# Patient Record
Sex: Male | Born: 1945 | Race: White | Hispanic: No | Marital: Married | Smoking: Former smoker
Health system: Southern US, Community
[De-identification: ages and names within clinical notes are randomized; demographics above are authoritative.]

## PROBLEM LIST (undated history)

## (undated) DIAGNOSIS — N529 Male erectile dysfunction, unspecified: Secondary | ICD-10-CM

## (undated) DIAGNOSIS — I739 Peripheral vascular disease, unspecified: Secondary | ICD-10-CM

## (undated) DIAGNOSIS — D649 Anemia, unspecified: Secondary | ICD-10-CM

## (undated) DIAGNOSIS — E785 Hyperlipidemia, unspecified: Secondary | ICD-10-CM

## (undated) DIAGNOSIS — R739 Hyperglycemia, unspecified: Secondary | ICD-10-CM

## (undated) DIAGNOSIS — R600 Localized edema: Secondary | ICD-10-CM

## (undated) DIAGNOSIS — N401 Enlarged prostate with lower urinary tract symptoms: Secondary | ICD-10-CM

## (undated) DIAGNOSIS — N419 Inflammatory disease of prostate, unspecified: Secondary | ICD-10-CM

## (undated) DIAGNOSIS — I10 Essential (primary) hypertension: Secondary | ICD-10-CM

## (undated) DIAGNOSIS — K219 Gastro-esophageal reflux disease without esophagitis: Secondary | ICD-10-CM

## (undated) DIAGNOSIS — N138 Other obstructive and reflux uropathy: Secondary | ICD-10-CM

## (undated) DIAGNOSIS — N400 Enlarged prostate without lower urinary tract symptoms: Secondary | ICD-10-CM

## (undated) DIAGNOSIS — Z8673 Personal history of transient ischemic attack (TIA), and cerebral infarction without residual deficits: Secondary | ICD-10-CM

## (undated) DIAGNOSIS — I639 Cerebral infarction, unspecified: Secondary | ICD-10-CM

## (undated) HISTORY — DX: Hyperglycemia, unspecified: R73.9

## (undated) HISTORY — DX: Benign prostatic hyperplasia without lower urinary tract symptoms: N40.0

## (undated) HISTORY — DX: Cerebral infarction, unspecified: I63.9

## (undated) HISTORY — DX: Other obstructive and reflux uropathy: N13.8

## (undated) HISTORY — DX: Essential (primary) hypertension: I10

## (undated) HISTORY — PX: COLONOSCOPY WITH ESOPHAGOGASTRODUODENOSCOPY (EGD): SHX5779

## (undated) HISTORY — DX: Inflammatory disease of prostate, unspecified: N41.9

## (undated) HISTORY — DX: Benign prostatic hyperplasia with lower urinary tract symptoms: N40.1

## (undated) HISTORY — DX: Gastro-esophageal reflux disease without esophagitis: K21.9

## (undated) HISTORY — DX: Male erectile dysfunction, unspecified: N52.9

## (undated) HISTORY — DX: Anemia, unspecified: D64.9

---

## 1997-09-28 ENCOUNTER — Emergency Department (HOSPITAL_COMMUNITY): Admission: EM | Admit: 1997-09-28 | Discharge: 1997-09-28 | Payer: Self-pay | Admitting: Emergency Medicine

## 1998-12-01 ENCOUNTER — Emergency Department (HOSPITAL_COMMUNITY): Admission: EM | Admit: 1998-12-01 | Discharge: 1998-12-01 | Payer: Self-pay | Admitting: Emergency Medicine

## 1998-12-01 ENCOUNTER — Encounter: Payer: Self-pay | Admitting: Emergency Medicine

## 1999-09-08 ENCOUNTER — Ambulatory Visit (HOSPITAL_COMMUNITY): Admission: AD | Admit: 1999-09-08 | Discharge: 1999-09-08 | Payer: Self-pay | Admitting: Cardiovascular Disease

## 1999-09-20 ENCOUNTER — Ambulatory Visit (HOSPITAL_COMMUNITY): Admission: RE | Admit: 1999-09-20 | Discharge: 1999-09-20 | Payer: Self-pay | Admitting: Internal Medicine

## 1999-09-20 ENCOUNTER — Encounter: Payer: Self-pay | Admitting: Internal Medicine

## 2003-01-14 ENCOUNTER — Ambulatory Visit (HOSPITAL_COMMUNITY): Admission: RE | Admit: 2003-01-14 | Discharge: 2003-01-14 | Payer: Self-pay | Admitting: Internal Medicine

## 2003-01-15 ENCOUNTER — Ambulatory Visit (HOSPITAL_COMMUNITY): Admission: RE | Admit: 2003-01-15 | Discharge: 2003-01-15 | Payer: Self-pay | Admitting: Internal Medicine

## 2004-02-22 ENCOUNTER — Other Ambulatory Visit: Payer: Self-pay

## 2004-02-22 ENCOUNTER — Inpatient Hospital Stay: Payer: Self-pay | Admitting: Internal Medicine

## 2004-02-22 ENCOUNTER — Emergency Department: Payer: Self-pay | Admitting: Emergency Medicine

## 2004-07-16 ENCOUNTER — Inpatient Hospital Stay: Payer: Self-pay | Admitting: Internal Medicine

## 2010-04-09 ENCOUNTER — Encounter: Payer: Self-pay | Admitting: Internal Medicine

## 2011-11-27 ENCOUNTER — Ambulatory Visit: Payer: Self-pay | Admitting: Gastroenterology

## 2011-11-27 LAB — PROTIME-INR
INR: 1.1
Prothrombin Time: 14.4 secs (ref 11.5–14.7)

## 2011-11-28 LAB — PATHOLOGY REPORT

## 2013-10-21 ENCOUNTER — Ambulatory Visit: Payer: Self-pay | Admitting: Internal Medicine

## 2014-04-08 DIAGNOSIS — I6789 Other cerebrovascular disease: Secondary | ICD-10-CM | POA: Diagnosis not present

## 2014-04-09 DIAGNOSIS — K219 Gastro-esophageal reflux disease without esophagitis: Secondary | ICD-10-CM | POA: Diagnosis not present

## 2014-04-09 DIAGNOSIS — D649 Anemia, unspecified: Secondary | ICD-10-CM | POA: Diagnosis not present

## 2014-04-09 DIAGNOSIS — R739 Hyperglycemia, unspecified: Secondary | ICD-10-CM | POA: Diagnosis not present

## 2014-04-09 DIAGNOSIS — I10 Essential (primary) hypertension: Secondary | ICD-10-CM | POA: Diagnosis not present

## 2014-04-09 DIAGNOSIS — Z0000 Encounter for general adult medical examination without abnormal findings: Secondary | ICD-10-CM | POA: Diagnosis not present

## 2014-04-09 DIAGNOSIS — I739 Peripheral vascular disease, unspecified: Secondary | ICD-10-CM | POA: Diagnosis not present

## 2014-04-09 DIAGNOSIS — Z8673 Personal history of transient ischemic attack (TIA), and cerebral infarction without residual deficits: Secondary | ICD-10-CM | POA: Diagnosis not present

## 2014-04-09 DIAGNOSIS — E785 Hyperlipidemia, unspecified: Secondary | ICD-10-CM | POA: Diagnosis not present

## 2014-04-16 DIAGNOSIS — R739 Hyperglycemia, unspecified: Secondary | ICD-10-CM | POA: Diagnosis not present

## 2014-04-16 DIAGNOSIS — Z8673 Personal history of transient ischemic attack (TIA), and cerebral infarction without residual deficits: Secondary | ICD-10-CM | POA: Diagnosis not present

## 2014-04-16 DIAGNOSIS — E785 Hyperlipidemia, unspecified: Secondary | ICD-10-CM | POA: Diagnosis not present

## 2014-04-16 DIAGNOSIS — I10 Essential (primary) hypertension: Secondary | ICD-10-CM | POA: Diagnosis not present

## 2014-04-21 ENCOUNTER — Ambulatory Visit: Payer: Self-pay | Admitting: Internal Medicine

## 2014-04-21 DIAGNOSIS — I6523 Occlusion and stenosis of bilateral carotid arteries: Secondary | ICD-10-CM | POA: Diagnosis not present

## 2014-05-14 DIAGNOSIS — Z8673 Personal history of transient ischemic attack (TIA), and cerebral infarction without residual deficits: Secondary | ICD-10-CM | POA: Diagnosis not present

## 2014-05-14 DIAGNOSIS — I739 Peripheral vascular disease, unspecified: Secondary | ICD-10-CM | POA: Diagnosis not present

## 2014-05-14 DIAGNOSIS — I6789 Other cerebrovascular disease: Secondary | ICD-10-CM | POA: Diagnosis not present

## 2014-06-11 DIAGNOSIS — I6789 Other cerebrovascular disease: Secondary | ICD-10-CM | POA: Diagnosis not present

## 2014-07-14 DIAGNOSIS — I6789 Other cerebrovascular disease: Secondary | ICD-10-CM | POA: Diagnosis not present

## 2014-08-13 DIAGNOSIS — I6789 Other cerebrovascular disease: Secondary | ICD-10-CM | POA: Diagnosis not present

## 2014-09-10 DIAGNOSIS — Z8673 Personal history of transient ischemic attack (TIA), and cerebral infarction without residual deficits: Secondary | ICD-10-CM | POA: Diagnosis not present

## 2014-10-13 DIAGNOSIS — Z125 Encounter for screening for malignant neoplasm of prostate: Secondary | ICD-10-CM | POA: Diagnosis not present

## 2014-10-13 DIAGNOSIS — E785 Hyperlipidemia, unspecified: Secondary | ICD-10-CM | POA: Diagnosis not present

## 2014-10-13 DIAGNOSIS — D649 Anemia, unspecified: Secondary | ICD-10-CM | POA: Diagnosis not present

## 2014-10-13 DIAGNOSIS — I739 Peripheral vascular disease, unspecified: Secondary | ICD-10-CM | POA: Diagnosis not present

## 2014-10-13 DIAGNOSIS — I10 Essential (primary) hypertension: Secondary | ICD-10-CM | POA: Diagnosis not present

## 2014-10-20 DIAGNOSIS — I10 Essential (primary) hypertension: Secondary | ICD-10-CM | POA: Diagnosis not present

## 2014-10-20 DIAGNOSIS — R739 Hyperglycemia, unspecified: Secondary | ICD-10-CM | POA: Diagnosis not present

## 2014-10-20 DIAGNOSIS — Z8673 Personal history of transient ischemic attack (TIA), and cerebral infarction without residual deficits: Secondary | ICD-10-CM | POA: Diagnosis not present

## 2014-10-20 DIAGNOSIS — E784 Other hyperlipidemia: Secondary | ICD-10-CM | POA: Diagnosis not present

## 2014-11-13 DIAGNOSIS — Z8673 Personal history of transient ischemic attack (TIA), and cerebral infarction without residual deficits: Secondary | ICD-10-CM | POA: Diagnosis not present

## 2014-11-18 ENCOUNTER — Encounter: Payer: Self-pay | Admitting: Urology

## 2014-12-01 ENCOUNTER — Encounter: Payer: Self-pay | Admitting: Urology

## 2014-12-01 ENCOUNTER — Ambulatory Visit (INDEPENDENT_AMBULATORY_CARE_PROVIDER_SITE_OTHER): Payer: Commercial Managed Care - HMO | Admitting: Urology

## 2014-12-01 VITALS — BP 164/105 | HR 66 | Ht 72.0 in | Wt 204.1 lb

## 2014-12-01 DIAGNOSIS — N400 Enlarged prostate without lower urinary tract symptoms: Secondary | ICD-10-CM

## 2014-12-01 LAB — MICROSCOPIC EXAMINATION
Bacteria, UA: NONE SEEN
Epithelial Cells (non renal): NONE SEEN /hpf (ref 0–10)
RBC, UA: NONE SEEN /hpf (ref 0–?)

## 2014-12-01 LAB — URINALYSIS, COMPLETE
Bilirubin, UA: NEGATIVE
Glucose, UA: NEGATIVE
Ketones, UA: NEGATIVE
Leukocytes, UA: NEGATIVE
Nitrite, UA: NEGATIVE
Protein, UA: NEGATIVE
RBC, UA: NEGATIVE
Specific Gravity, UA: 1.015 (ref 1.005–1.030)
Urobilinogen, Ur: 0.2 mg/dL (ref 0.2–1.0)
pH, UA: 7 (ref 5.0–7.5)

## 2014-12-01 LAB — BLADDER SCAN AMB NON-IMAGING: Scan Result: 150

## 2014-12-01 MED ORDER — SILDENAFIL CITRATE 100 MG PO TABS: 100.0000 mg | ORAL_TABLET | Freq: Every day | ORAL | Status: DC | PRN

## 2014-12-01 MED ORDER — FINASTERIDE 5 MG PO TABS: 5.0000 mg | ORAL_TABLET | Freq: Every day | ORAL | Status: DC

## 2014-12-01 NOTE — Progress Notes (Signed)
12/01/2014 3:41 PM   Kenneth Hawkins 1945/09/26 [id number]  Referring provider: No referring provider defined for this encounter.  Chief Complaint  Patient presents with  . Benign Prostatic Hypertrophy    HPI: History of nocturia and frequency during the day now well controlled with finasteride. Erectile dysfunction and now doing well on Viagra 100 mg    PMH: Past Medical History  Diagnosis Date  . CVA (cerebral vascular accident)   . Anemia   . Hypertension   . Hyperglycemia   . GERD (gastroesophageal reflux disease)   . BPH (benign prostatic hyperplasia)   . ED (erectile dysfunction)   . Prostatitis   . BPH with obstruction/lower urinary tract symptoms     Surgical History: Past Surgical History  Procedure Laterality Date  . None      Home Medications:    Medication List       This list is accurate as of: 12/01/14  3:41 PM.  Always use your most recent med list.               aspirin EC 81 MG tablet  Take by mouth.     finasteride 5 MG tablet  Commonly known as:  PROSCAR  Take by mouth.     HYZAAR 100-25 MG per tablet  Generic drug:  losartan-hydrochlorothiazide  Take by mouth.     omeprazole 20 MG capsule  Commonly known as:  PRILOSEC  TAKE 1 CAPSULE EVERY DAY     sildenafil 100 MG tablet  Commonly known as:  VIAGRA  Take by mouth.     simvastatin 80 MG tablet  Commonly known as:  ZOCOR  TAKE 1 TABLET ONE TIME DAILY     warfarin 3 MG tablet  Commonly known as:  COUMADIN  TAKE 1 TABLET EVERY DAY        Allergies:  Allergies  Allergen Reactions  . Ace Inhibitors Cough    Family History: No family history on file.  Social History:  reports that he has quit smoking. He does not have any smokeless tobacco history on file. His alcohol and drug histories are not on file.  ROS: UROLOGY Frequent Urination?: Yes Hard to postpone urination?: No Burning/pain with urination?: No Get up at night to urinate?: No Leakage of urine?:  No Urine stream starts and stops?: No Trouble starting stream?: No Do you have to strain to urinate?: No Blood in urine?: No Urinary tract infection?: No Sexually transmitted disease?: No Injury to kidneys or bladder?: No Painful intercourse?: No Weak stream?: No Erection problems?: Yes Penile pain?: No  Gastrointestinal Nausea?: No Vomiting?: No Indigestion/heartburn?: Yes Diarrhea?: No Constipation?: No  Constitutional Fever: No Night sweats?: No Weight loss?: No Fatigue?: No  Skin Skin rash/lesions?: No Itching?: No  Eyes Blurred vision?: No Double vision?: No  Ears/Nose/Throat Sore throat?: No Sinus problems?: No  Hematologic/Lymphatic Swollen glands?: No Easy bruising?: No  Cardiovascular Leg swelling?: No Chest pain?: No  Respiratory Cough?: No Shortness of breath?: No  Endocrine Excessive thirst?: No  Musculoskeletal Back pain?: No Joint pain?: No  Neurological Headaches?: No Dizziness?: No  Psychologic Depression?: No Anxiety?: No  Physical Exam: BP 164/105 mmHg  Pulse 66  Ht 6' (1.829 m)  Wt 204 lb 1.6 oz (92.579 kg)  BMI 27.67 kg/m2  Constitutional:  Alert and oriented, No acute distress. HEENT: [place] AT, moist mucus membranes.  Trachea midline, no masses. Cardiovascular: No clubbing, cyanosis, or edema. Respiratory: Normal respiratory effort, no increased work of breathing. GI:  Abdomen is soft, nontender, nondistended, no abdominal masses right-sided inguinal indirect hernia GU: No CVA tenderness. Rectal sphincter excellent shape. Is to have a colonoscopy soon as he's had polyps in the past. He gets colonoscopy every 3 years. Prostate is small. Circumcised. His scrotum is normal testes are normal Skin: No rashes, bruises or suspicious lesions. Lymph: No cervical or inguinal adenopathy. Neurologic: Grossly intact, no focal deficits, moving all 4 extremities. Psychiatric: Normal mood and affect.  Laboratory Data: No results  found for: WBC, HGB, HCT, MCV, PLT  No results found for: CREATININE  No results found for: PSA  No results found for: TESTOSTERONE  No results found for: HGBA1C  Urinalysis No results found for: COLORURINE, APPEARANCEUR, LABSPEC, PHURINE, GLUCOSEU, HGBUR, BILIRUBINUR, KETONESUR, PROTEINUR, UROBILINOGEN, NITRITE, LEUKOCYTESUR  Pertinent Imaging: None   Assessment & Plan: Recheck PSA yearly recheck on BPH and prostate health doing well on finasteride and Viagra will order both  1. BPH (benign prostatic hyperplasia) Patient is improved vastly on his finasteride. Not voiding at night and can travel long distances unless he drinks too much coffee very pleased with therapy and wants to continue his finasteride - Urinalysis, Complete - BLADDER SCAN AMB NON-IMAGING - PSA   Return in about 1 year (around 12/01/2015) for PSA and recheck.  Collier Flowers, [facility] [address], [place], [place] [address] [phone]

## 2014-12-01 NOTE — Addendum Note (Signed)
Addended by: Rick Duff D on: 12/01/2014 03:54 PM   Modules accepted: Orders

## 2014-12-02 LAB — PSA: Prostate Specific Ag, Serum: 1.1 ng/mL (ref 0.0–4.0)

## 2014-12-17 DIAGNOSIS — Z8673 Personal history of transient ischemic attack (TIA), and cerebral infarction without residual deficits: Secondary | ICD-10-CM | POA: Diagnosis not present

## 2014-12-25 DIAGNOSIS — Z86010 Personal history of colonic polyps: Secondary | ICD-10-CM | POA: Diagnosis not present

## 2015-01-14 ENCOUNTER — Telehealth: Payer: Self-pay | Admitting: Urology

## 2015-01-14 DIAGNOSIS — Z8673 Personal history of transient ischemic attack (TIA), and cerebral infarction without residual deficits: Secondary | ICD-10-CM | POA: Diagnosis not present

## 2015-01-14 DIAGNOSIS — N400 Enlarged prostate without lower urinary tract symptoms: Secondary | ICD-10-CM

## 2015-01-14 MED ORDER — FINASTERIDE 5 MG PO TABS: 5.0000 mg | ORAL_TABLET | Freq: Every day | ORAL | Status: DC

## 2015-01-14 NOTE — Telephone Encounter (Signed)
Medications send to pt pharmacy. Pt made aware.

## 2015-01-14 NOTE — Telephone Encounter (Signed)
Patient requested from Dr. Elnoria Howard that his prescriptions last until his next office visit in 1 year.  When patient received his prescription, the medication only lasts 9 months.  Is it possible for [place] to extend his meds until his next office visit?

## 2015-01-29 ENCOUNTER — Encounter: Payer: Self-pay | Admitting: *Deleted

## 2015-02-01 ENCOUNTER — Ambulatory Visit
Admission: RE | Admit: 2015-02-01 | Discharge: 2015-02-01 | Disposition: A | Discharge status: Home / Self Care | Payer: Commercial Managed Care - HMO | Source: Ambulatory Visit | Attending: Gastroenterology | Admitting: Gastroenterology

## 2015-02-01 ENCOUNTER — Encounter: Payer: Self-pay | Admitting: *Deleted

## 2015-02-01 ENCOUNTER — Ambulatory Visit: Payer: Commercial Managed Care - HMO | Admitting: Anesthesiology

## 2015-02-01 ENCOUNTER — Encounter
Admission: RE | Disposition: A | Discharge status: Home / Self Care | Payer: Self-pay | Source: Ambulatory Visit | Attending: Gastroenterology

## 2015-02-01 DIAGNOSIS — Z888 Allergy status to other drugs, medicaments and biological substances status: Secondary | ICD-10-CM | POA: Insufficient documentation

## 2015-02-01 DIAGNOSIS — Z7901 Long term (current) use of anticoagulants: Secondary | ICD-10-CM | POA: Insufficient documentation

## 2015-02-01 DIAGNOSIS — Z8673 Personal history of transient ischemic attack (TIA), and cerebral infarction without residual deficits: Secondary | ICD-10-CM | POA: Insufficient documentation

## 2015-02-01 DIAGNOSIS — K219 Gastro-esophageal reflux disease without esophagitis: Secondary | ICD-10-CM | POA: Insufficient documentation

## 2015-02-01 DIAGNOSIS — Z79899 Other long term (current) drug therapy: Secondary | ICD-10-CM | POA: Insufficient documentation

## 2015-02-01 DIAGNOSIS — D125 Benign neoplasm of sigmoid colon: Secondary | ICD-10-CM | POA: Diagnosis not present

## 2015-02-01 DIAGNOSIS — Z87891 Personal history of nicotine dependence: Secondary | ICD-10-CM | POA: Diagnosis not present

## 2015-02-01 DIAGNOSIS — N400 Enlarged prostate without lower urinary tract symptoms: Secondary | ICD-10-CM | POA: Insufficient documentation

## 2015-02-01 DIAGNOSIS — Z7982 Long term (current) use of aspirin: Secondary | ICD-10-CM | POA: Insufficient documentation

## 2015-02-01 DIAGNOSIS — I739 Peripheral vascular disease, unspecified: Secondary | ICD-10-CM | POA: Insufficient documentation

## 2015-02-01 DIAGNOSIS — D127 Benign neoplasm of rectosigmoid junction: Secondary | ICD-10-CM | POA: Diagnosis not present

## 2015-02-01 DIAGNOSIS — I10 Essential (primary) hypertension: Secondary | ICD-10-CM | POA: Insufficient documentation

## 2015-02-01 DIAGNOSIS — Z86010 Personal history of colonic polyps: Secondary | ICD-10-CM | POA: Diagnosis not present

## 2015-02-01 DIAGNOSIS — K635 Polyp of colon: Secondary | ICD-10-CM | POA: Insufficient documentation

## 2015-02-01 HISTORY — DX: Personal history of transient ischemic attack (TIA), and cerebral infarction without residual deficits: Z86.73

## 2015-02-01 HISTORY — PX: COLONOSCOPY WITH PROPOFOL: SHX5780

## 2015-02-01 HISTORY — DX: Peripheral vascular disease, unspecified: I73.9

## 2015-02-01 LAB — PROTIME-INR
INR: 1.13
Prothrombin Time: 14.7 seconds (ref 11.4–15.0)

## 2015-02-01 SURGERY — COLONOSCOPY WITH PROPOFOL
Anesthesia: General

## 2015-02-01 MED ORDER — LIDOCAINE HCL (CARDIAC) 20 MG/ML IV SOLN
INTRAVENOUS | Status: DC | PRN
  Administered 2015-02-01: 30 mg via INTRAVENOUS

## 2015-02-01 MED ORDER — EPHEDRINE SULFATE 50 MG/ML IJ SOLN
INTRAMUSCULAR | Status: DC | PRN
  Administered 2015-02-01: 5 mg via INTRAVENOUS

## 2015-02-01 MED ORDER — GLYCOPYRROLATE 0.2 MG/ML IJ SOLN
INTRAMUSCULAR | Status: DC | PRN
Start: 2015-02-01 — End: 2015-02-01
  Administered 2015-02-01: 0.2 mg via INTRAVENOUS

## 2015-02-01 MED ORDER — PROPOFOL 500 MG/50ML IV EMUL
INTRAVENOUS | Status: DC | PRN
  Administered 2015-02-01: 160 ug/kg/min via INTRAVENOUS

## 2015-02-01 MED ORDER — PROPOFOL 10 MG/ML IV BOLUS
INTRAVENOUS | Status: DC | PRN
  Administered 2015-02-01: 100 mg via INTRAVENOUS

## 2015-02-01 MED ORDER — LIDOCAINE HCL (PF) 1 % IJ SOLN
1.0000 mL | Freq: Once | INTRAMUSCULAR | Status: AC
  Administered 2015-02-01: 1 mL

## 2015-02-01 MED ORDER — SODIUM CHLORIDE 0.9 % IV SOLN
INTRAVENOUS | Status: DC
  Administered 2015-02-01 (×2): via INTRAVENOUS

## 2015-02-01 MED ORDER — FENTANYL CITRATE (PF) 100 MCG/2ML IJ SOLN
INTRAMUSCULAR | Status: DC | PRN
  Administered 2015-02-01: 50 ug via INTRAVENOUS

## 2015-02-01 MED ORDER — LIDOCAINE HCL (PF) 1 % IJ SOLN
INTRAMUSCULAR | Status: AC
  Filled 2015-02-01: qty 2

## 2015-02-01 MED ORDER — PHENYLEPHRINE HCL 10 MG/ML IJ SOLN
INTRAMUSCULAR | Status: DC | PRN
  Administered 2015-02-01 (×3): .1 ug via INTRAVENOUS

## 2015-02-01 MED ORDER — MIDAZOLAM HCL 5 MG/5ML IJ SOLN
INTRAMUSCULAR | Status: DC | PRN
  Administered 2015-02-01: 1 mg via INTRAVENOUS

## 2015-02-01 NOTE — Op Note (Signed)
[facility] Gastroenterology Patient Name: Kenneth Hawkins Procedure Date: 02/01/2015 4:35 PM MRN: [id number] Account #: [id number] Date of Birth: 1945-06-26 Admit Type: Outpatient Age: 69 Room: [facility] ENDO ROOM 3 Gender: Male Note Status: Finalized Procedure:         Colonoscopy Indications:       Personal history of colonic polyps Providers:         Lollie Sails, MD Medicines:         Monitored Anesthesia Care Complications:     No immediate complications. Procedure:         Pre-Anesthesia Assessment:                    - ASA Grade Assessment: III - A patient with severe                     systemic disease.                    After obtaining informed consent, the colonoscope was                     passed under direct vision. Throughout the procedure, the                     patient's blood pressure, pulse, and oxygen saturations                     were monitored continuously. The Colonoscope was                     introduced through the anus and advanced to the the cecum,                     identified by appendiceal orifice and ileocecal valve. The                     colonoscopy was performed without difficulty. The patient                     tolerated the procedure well. The quality of the bowel                     preparation was fair. Findings:      A 2 mm polyp was found in the mid sigmoid colon. The polyp was sessile.       The polyp was removed with a cold biopsy forceps. Resection and       retrieval were complete.      Six sessile polyps were found in the recto-sigmoid colon. The polyps       were 1 to 2 mm in size. These polyps were removed with a cold biopsy       forceps. Resection and retrieval were complete.      The digital rectal exam was normal.      The retroflexed view of the distal rectum and anal verge was normal and       showed no anal or rectal abnormalities. Impression:        - One 2 mm polyp in the mid sigmoid colon.  Resected and                     retrieved.                    - Six 1  to 2 mm polyps at the recto-sigmoid colon.                     Resected and retrieved.                    - The distal rectum and anal verge are normal on                     retroflexion view. Recommendation:    - Discharge patient to home. Procedure Code(s): --- Professional ---                    [phone], Colonoscopy, flexible; with biopsy, single or                     multiple Diagnosis Code(s): --- Professional ---                    D12.5, Benign neoplasm of sigmoid colon                    D12.7, Benign neoplasm of rectosigmoid junction                    Z86.010, Personal history of colonic polyps CPT copyright 2014 American [employer]. All rights reserved. The codes documented in this report are preliminary and upon coder review may  be revised to meet current compliance requirements. Lollie Sails, MD 02/01/2015 5:03:24 PM This report has been signed electronically. Number of Addenda: 0 Note Initiated On: 02/01/2015 4:35 PM Scope Withdrawal Time: 0 hours 16 minutes 43 seconds  Total Procedure Duration: 0 hours 25 minutes 16 seconds       [facility]

## 2015-02-01 NOTE — H&P (Signed)
Outpatient short stay form Pre-procedure 02/01/2015 3:07 PM Lollie Sails MD  Primary Physician: Dr. Ramonita Lab  Reason for visit:  Colonoscopy  History of present illness:  Personal history colon polyps. Patient is a 69 year old male presenting today with a personal history of adenomatous colon polyps. His last colonoscopy was about 3 years ago. That 9 polyps removed at that time of which most of them were adenoma. He did well with his prep for the procedure. He takes and which has been held and pro time is ordered and pending. So stopped and 81 mg aspirin last week as well. He takes no other aspirin products or other anticoagulants.    Current facility-administered medications:  .  0.9 %  sodium chloride infusion, , Intravenous, Continuous, Lollie Sails, MD, Last Rate: 20 mL/hr at 02/01/15 1448 .  lidocaine (PF) (XYLOCAINE) 1 % injection, , , ,   Prescriptions prior to admission  Medication Sig Dispense Refill Last Dose  . losartan-hydrochlorothiazide (HYZAAR) 100-25 MG per tablet Take by mouth.   02/01/2015 at 0730  . aspirin EC 81 MG tablet Take by mouth.     . finasteride (PROSCAR) 5 MG tablet Take by mouth.     . finasteride (PROSCAR) 5 MG tablet Take 1 tablet (5 mg total) by mouth daily. 90 tablet 3   . omeprazole (PRILOSEC) 20 MG capsule TAKE 1 CAPSULE EVERY DAY     . sildenafil (VIAGRA) 100 MG tablet Take by mouth.     . sildenafil (VIAGRA) 100 MG tablet Take 1 tablet (100 mg total) by mouth daily as needed for erectile dysfunction. 6 tablet 0   . sildenafil (VIAGRA) 100 MG tablet Take 1 tablet (100 mg total) by mouth daily as needed for erectile dysfunction. 6 tablet 0   . simvastatin (ZOCOR) 80 MG tablet TAKE 1 TABLET ONE TIME DAILY     . warfarin (COUMADIN) 3 MG tablet TAKE 1 TABLET EVERY DAY   01/26/2015     Allergies  Allergen Reactions  . Ace Inhibitors Cough     Past Medical History  Diagnosis Date  . CVA (cerebral vascular accident) ([place])   . Anemia    . Hypertension   . Hyperglycemia   . GERD (gastroesophageal reflux disease)   . BPH (benign prostatic hyperplasia)   . ED (erectile dysfunction)   . Prostatitis   . BPH with obstruction/lower urinary tract symptoms   . History of TIAs   . Peripheral vascular disease ([place])     Review of systems:      Physical Exam    Heart and lungs: Regular rate and rhythm without rub or gallop, lungs are bilaterally clear    HEENT: Normocephalic atraumatic eyes are anicteric    Other:     Pertinant exam for procedure: Soft nontender nondistended bowel sounds positive normoactive.    Planned proceedures: Colonoscopy and indicated procedures. Outpatient short stay form Pre-procedure 02/01/2015 3:25 PM Lollie Sails MD   I have discussed the risks benefits and complications of procedures to include not limited to bleeding, infection, perforation and the risk of sedation and the patient wishes to proceed.

## 2015-02-01 NOTE — Anesthesia Preprocedure Evaluation (Signed)
Anesthesia Evaluation  Patient identified by MRN, date of birth, ID band Patient awake    Reviewed: Allergy & Precautions, H&P , NPO status , Patient's Chart, lab work & pertinent test results, reviewed documented beta blocker date and time   Airway Mallampati: II  TM Distance: >3 FB Neck ROM: full    Dental no notable dental hx.    Pulmonary neg pulmonary ROS, former smoker,    Pulmonary exam normal breath sounds clear to auscultation       Cardiovascular Exercise Tolerance: Good hypertension, + Peripheral Vascular Disease  negative cardio ROS   Rhythm:regular Rate:Normal     Neuro/Psych CVA, No Residual Symptoms negative neurological ROS  negative psych ROS   GI/Hepatic negative GI ROS, Neg liver ROS, GERD  ,  Endo/Other  negative endocrine ROS  Renal/GU negative Renal ROS  negative genitourinary   Musculoskeletal   Abdominal   Peds  Hematology negative hematology ROS (+) anemia ,   Anesthesia Other Findings   Reproductive/Obstetrics negative OB ROS                             Anesthesia Physical Anesthesia Plan  ASA: III  Anesthesia Plan: General   Post-op Pain Management:    Induction:   Airway Management Planned:   Additional Equipment:   Intra-op Plan:   Post-operative Plan:   Informed Consent: I have reviewed the patients History and Physical, chart, labs and discussed the procedure including the risks, benefits and alternatives for the proposed anesthesia with the patient or authorized representative who has indicated his/her understanding and acceptance.   Dental Advisory Given  Plan Discussed with: CRNA  Anesthesia Plan Comments:         Anesthesia Quick Evaluation

## 2015-02-01 NOTE — Transfer of Care (Signed)
Immediate Anesthesia Transfer of Care Note  Patient: Kenneth Hawkins  Procedure(s) Performed: Procedure(s): COLONOSCOPY WITH PROPOFOL (N/A)  Patient Location: PACU and Short Stay  Anesthesia Type:General  Level of Consciousness: awake and sedated  Airway & Oxygen Therapy: Patient Spontanous Breathing and Patient connected to nasal cannula oxygen  Post-op Assessment: Report given to RN and Post -op Vital signs reviewed and stable  Post vital signs: Reviewed and stable  Last Vitals:  Filed Vitals:   02/01/15 1425  BP: 154/79  Pulse: 69  Temp: 35.8 C  Resp: 16    Complications: No apparent anesthesia complications

## 2015-02-03 ENCOUNTER — Encounter: Payer: Self-pay | Admitting: Gastroenterology

## 2015-02-03 LAB — SURGICAL PATHOLOGY

## 2015-02-09 NOTE — Anesthesia Postprocedure Evaluation (Signed)
Anesthesia Post Note  Patient: Kenneth Hawkins  Procedure(s) Performed: Procedure(s) (LRB): COLONOSCOPY WITH PROPOFOL (N/A)  Patient location during evaluation: Endoscopy Anesthesia Type: General Level of consciousness: awake and alert Pain management: pain level controlled Vital Signs Assessment: post-procedure vital signs reviewed and stable Respiratory status: spontaneous breathing, nonlabored ventilation, respiratory function stable and patient connected to nasal cannula oxygen Cardiovascular status: blood pressure returned to baseline and stable Postop Assessment: No signs of nausea or vomiting Anesthetic complications: no    Last Vitals:  Filed Vitals:   02/01/15 1730 02/01/15 1740  BP: 117/81 128/80  Pulse: 71 67  Temp:    Resp: 13 12    Last Pain: There were no vitals filed for this visit.               Molli Barrows

## 2015-02-17 DIAGNOSIS — Z8673 Personal history of transient ischemic attack (TIA), and cerebral infarction without residual deficits: Secondary | ICD-10-CM | POA: Diagnosis not present

## 2015-03-18 DIAGNOSIS — Z8673 Personal history of transient ischemic attack (TIA), and cerebral infarction without residual deficits: Secondary | ICD-10-CM | POA: Diagnosis not present

## 2015-04-14 DIAGNOSIS — Z8673 Personal history of transient ischemic attack (TIA), and cerebral infarction without residual deficits: Secondary | ICD-10-CM | POA: Diagnosis not present

## 2015-04-15 DIAGNOSIS — I10 Essential (primary) hypertension: Secondary | ICD-10-CM | POA: Diagnosis not present

## 2015-04-15 DIAGNOSIS — I739 Peripheral vascular disease, unspecified: Secondary | ICD-10-CM | POA: Diagnosis not present

## 2015-04-22 DIAGNOSIS — Z8673 Personal history of transient ischemic attack (TIA), and cerebral infarction without residual deficits: Secondary | ICD-10-CM | POA: Diagnosis not present

## 2015-04-22 DIAGNOSIS — D6489 Other specified anemias: Secondary | ICD-10-CM | POA: Diagnosis not present

## 2015-04-22 DIAGNOSIS — K219 Gastro-esophageal reflux disease without esophagitis: Secondary | ICD-10-CM | POA: Diagnosis not present

## 2015-04-22 DIAGNOSIS — I739 Peripheral vascular disease, unspecified: Secondary | ICD-10-CM | POA: Diagnosis not present

## 2015-04-22 DIAGNOSIS — Z125 Encounter for screening for malignant neoplasm of prostate: Secondary | ICD-10-CM | POA: Diagnosis not present

## 2015-04-22 DIAGNOSIS — I10 Essential (primary) hypertension: Secondary | ICD-10-CM | POA: Diagnosis not present

## 2015-04-22 DIAGNOSIS — R739 Hyperglycemia, unspecified: Secondary | ICD-10-CM | POA: Diagnosis not present

## 2015-04-22 DIAGNOSIS — E784 Other hyperlipidemia: Secondary | ICD-10-CM | POA: Diagnosis not present

## 2015-05-13 DIAGNOSIS — Z8673 Personal history of transient ischemic attack (TIA), and cerebral infarction without residual deficits: Secondary | ICD-10-CM | POA: Diagnosis not present

## 2015-05-20 ENCOUNTER — Emergency Department: Payer: Commercial Managed Care - HMO

## 2015-05-20 ENCOUNTER — Encounter: Payer: Self-pay | Admitting: *Deleted

## 2015-05-20 ENCOUNTER — Emergency Department
Admission: EM | Admit: 2015-05-20 | Discharge: 2015-05-20 | Disposition: A | Discharge status: Home / Self Care | Payer: Commercial Managed Care - HMO | Attending: Emergency Medicine | Admitting: Emergency Medicine

## 2015-05-20 DIAGNOSIS — Z7901 Long term (current) use of anticoagulants: Secondary | ICD-10-CM | POA: Diagnosis not present

## 2015-05-20 DIAGNOSIS — J111 Influenza due to unidentified influenza virus with other respiratory manifestations: Secondary | ICD-10-CM

## 2015-05-20 DIAGNOSIS — J09X2 Influenza due to identified novel influenza A virus with other respiratory manifestations: Secondary | ICD-10-CM | POA: Diagnosis not present

## 2015-05-20 DIAGNOSIS — R55 Syncope and collapse: Secondary | ICD-10-CM

## 2015-05-20 DIAGNOSIS — R531 Weakness: Secondary | ICD-10-CM | POA: Insufficient documentation

## 2015-05-20 DIAGNOSIS — Z87891 Personal history of nicotine dependence: Secondary | ICD-10-CM | POA: Insufficient documentation

## 2015-05-20 DIAGNOSIS — Z79899 Other long term (current) drug therapy: Secondary | ICD-10-CM | POA: Diagnosis not present

## 2015-05-20 DIAGNOSIS — I10 Essential (primary) hypertension: Secondary | ICD-10-CM | POA: Diagnosis not present

## 2015-05-20 DIAGNOSIS — E860 Dehydration: Secondary | ICD-10-CM | POA: Diagnosis not present

## 2015-05-20 DIAGNOSIS — R05 Cough: Secondary | ICD-10-CM | POA: Diagnosis present

## 2015-05-20 DIAGNOSIS — R69 Illness, unspecified: Secondary | ICD-10-CM

## 2015-05-20 DIAGNOSIS — S0990XA Unspecified injury of head, initial encounter: Secondary | ICD-10-CM | POA: Diagnosis not present

## 2015-05-20 LAB — PROTIME-INR
INR: 1.9
Prothrombin Time: 21.7 seconds — ABNORMAL HIGH (ref 11.4–15.0)

## 2015-05-20 LAB — BASIC METABOLIC PANEL
Anion gap: 10 (ref 5–15)
BUN: 24 mg/dL — ABNORMAL HIGH (ref 6–20)
CO2: 27 mmol/L (ref 22–32)
Calcium: 8.4 mg/dL — ABNORMAL LOW (ref 8.9–10.3)
Chloride: 98 mmol/L — ABNORMAL LOW (ref 101–111)
Creatinine, Ser: 1.99 mg/dL — ABNORMAL HIGH (ref 0.61–1.24)
GFR calc Af Amer: 38 mL/min — ABNORMAL LOW (ref >60)
GFR calc non Af Amer: 33 mL/min — ABNORMAL LOW (ref >60)
Glucose, Bld: 129 mg/dL — ABNORMAL HIGH (ref 65–99)
Potassium: 3.6 mmol/L (ref 3.5–5.1)
Sodium: 135 mmol/L (ref 135–145)

## 2015-05-20 LAB — CBC
HCT: 44 % (ref 40.0–52.0)
Hemoglobin: 14.8 g/dL (ref 13.0–18.0)
MCH: 29.2 pg (ref 26.0–34.0)
MCHC: 33.7 g/dL (ref 32.0–36.0)
MCV: 86.6 fL (ref 80.0–100.0)
Platelets: 178 10*3/uL (ref 150–440)
RBC: 5.08 MIL/uL (ref 4.40–5.90)
RDW: 14.5 % (ref 11.5–14.5)
WBC: 6.9 10*3/uL (ref 3.8–10.6)

## 2015-05-20 LAB — TROPONIN I: Troponin I: <0.03 ng/mL (ref <0.031)

## 2015-05-20 MED ORDER — RANITIDINE HCL 150 MG PO CAPS: 150.0000 mg | ORAL_CAPSULE | Freq: Two times a day (BID) | ORAL | Status: DC

## 2015-05-20 MED ORDER — SODIUM CHLORIDE 0.9 % IV BOLUS (SEPSIS)
2000.0000 mL | Freq: Once | INTRAVENOUS | Status: AC
  Administered 2015-05-20: 2000 mL via INTRAVENOUS

## 2015-05-20 MED ORDER — ONDANSETRON 4 MG PO TBDP: 4.0000 mg | ORAL_TABLET | Freq: Three times a day (TID) | ORAL | Status: DC | PRN

## 2015-05-20 NOTE — ED Notes (Signed)
Pt states he has been sick with flu like sx for the past 4 days, cough congestion.. States this morning while in the shower he had 2 syncople episodes.. States he is having right buttock pain from the fall.Marland Kitchen

## 2015-05-20 NOTE — Discharge Instructions (Signed)
Dehydration, Adult Dehydration is a condition in which you do not have enough fluid or water in your body. It happens when you take in less fluid than you lose. Vital organs such as the kidneys, brain, and heart cannot function without a proper amount of fluids. Any loss of fluids from the body can cause dehydration.  Dehydration can range from mild to severe. This condition should be treated right away to help prevent it from becoming severe. CAUSES  This condition may be caused by:  Vomiting.  Diarrhea.  Excessive sweating, such as when exercising in hot or humid weather.  Not drinking enough fluid during strenuous exercise or during an illness.  Excessive urine output.  Fever.  Certain medicines. RISK FACTORS This condition is more likely to develop in:  People who are taking certain medicines that cause the body to lose excess fluid (diuretics).   People who have a chronic illness, such as diabetes, that may increase urination.  Older adults.   People who live at high altitudes.   People who participate in endurance sports.  SYMPTOMS  Mild Dehydration  Thirst.  Dry lips.  Slightly dry mouth.  Dry, warm skin. Moderate Dehydration  Very dry mouth.   Muscle cramps.   Dark urine and decreased urine production.   Decreased tear production.   Headache.   Light-headedness, especially when you stand up from a sitting position.  Severe Dehydration  Changes in skin.   Cold and clammy skin.   Skin does not spring back quickly when lightly pinched and released.   Changes in body fluids.   Extreme thirst.   No tears.   Not able to sweat when body temperature is high, such as in hot weather.   Minimal urine production.   Changes in vital signs.   Rapid, weak pulse (more than 100 beats per minute when you are sitting still).   Rapid breathing.   Low blood pressure.   Other changes.   Sunken eyes.   Cold hands and feet.    Confusion.  Lethargy and difficulty being awakened.  Fainting (syncope).   Short-term weight loss.   Unconsciousness. DIAGNOSIS  This condition may be diagnosed based on your symptoms. You may also have tests to determine how severe your dehydration is. These tests may include:   Urine tests.   Blood tests.  TREATMENT  Treatment for this condition depends on the severity. Mild or moderate dehydration can often be treated at home. Treatment should be started right away. Do not wait until dehydration becomes severe. Severe dehydration needs to be treated at the hospital. Treatment for Mild Dehydration  Drinking plenty of water to replace the fluid you have lost.   Replacing minerals in your blood (electrolytes) that you may have lost.  Treatment for Moderate Dehydration  Consuming oral rehydration solution (ORS). Treatment for Severe Dehydration  Receiving fluid through an IV tube.   Receiving electrolyte solution through a feeding tube that is passed through your nose and into your stomach (nasogastric tube or NG tube).  Correcting any abnormalities in electrolytes. HOME CARE INSTRUCTIONS   Drink enough fluid to keep your urine clear or pale yellow.   Drink water or fluid slowly by taking small sips. You can also try sucking on ice cubes.  Have food or beverages that contain electrolytes. Examples include bananas and sports drinks.  Take over-the-counter and prescription medicines only as told by your health care provider.   Prepare ORS according to the manufacturer's instructions. Take sips  of ORS every 5 minutes until your urine returns to normal.  If you have vomiting or diarrhea, continue to try to drink water, ORS, or both.   If you have diarrhea, avoid:   Beverages that contain caffeine.   Fruit juice.   Milk.   Carbonated soft drinks.  Do not take salt tablets. This can lead to the condition of having too much sodium in your body  (hypernatremia).  SEEK MEDICAL CARE IF:  You cannot eat or drink without vomiting.  You have had moderate diarrhea during a period of more than 24 hours.  You have a fever. SEEK IMMEDIATE MEDICAL CARE IF:   You have extreme thirst.  You have severe diarrhea.  You have not urinated in 6-8 hours, or you have urinated only a small amount of very dark urine.  You have shriveled skin.  You are dizzy, confused, or both.   This information is not intended to replace advice given to you by your health care provider. Make sure you discuss any questions you have with your health care provider.   Document Released: 03/06/2005 Document Revised: 11/25/2014 Document Reviewed: 07/22/2014 Elsevier Interactive Patient Education 2016 [employer]  Influenza, Adult Influenza ("the flu") is a viral infection of the respiratory tract. It occurs more often in winter months because people spend more time in close contact with one another. Influenza can make you feel very sick. Influenza easily spreads from person to person (contagious). CAUSES  Influenza is caused by a virus that infects the respiratory tract. You can catch the virus by breathing in droplets from an infected person's cough or sneeze. You can also catch the virus by touching something that was recently contaminated with the virus and then touching your mouth, nose, or eyes. RISKS AND COMPLICATIONS You may be at risk for a more severe case of influenza if you smoke cigarettes, have diabetes, have chronic heart disease (such as heart failure) or lung disease (such as asthma), or if you have a weakened immune system. Elderly people and pregnant women are also at risk for more serious infections. The most common problem of influenza is a lung infection (pneumonia). Sometimes, this problem can require emergency medical care and may be life threatening. SIGNS AND SYMPTOMS  Symptoms typically last 4 to 10 days and may  include:  Fever.  Chills.  Headache, body aches, and muscle aches.  Sore throat.  Chest discomfort and cough.  Poor appetite.  Weakness or feeling tired.  Dizziness.  Nausea or vomiting. DIAGNOSIS  Diagnosis of influenza is often made based on your history and a physical exam. A nose or throat swab test can be done to confirm the diagnosis. TREATMENT  In mild cases, influenza goes away on its own. Treatment is directed at relieving symptoms. For more severe cases, your health care provider may prescribe antiviral medicines to shorten the sickness. Antibiotic medicines are not effective because the infection is caused by a virus, not by bacteria. HOME CARE INSTRUCTIONS  Take medicines only as directed by your health care provider.  Use a cool mist humidifier to make breathing easier.  Get plenty of rest until your temperature returns to normal. This usually takes 3 to 4 days.  Drink enough fluid to keep your urine clear or pale yellow.  Cover yourmouth and nosewhen coughing or sneezing,and wash your handswellto prevent thevirusfrom spreading.  Stay homefromwork orschool untilthe fever is gonefor at least 68full day. PREVENTION  An annual influenza vaccination (flu shot) is the best  way to avoid getting influenza. An annual flu shot is now routinely recommended for all adults in the [place] IF:  You experiencechest pain, yourcough worsens,or you producemore mucus.  Youhave nausea,vomiting, ordiarrhea.  Your fever returns or gets worse. SEEK IMMEDIATE MEDICAL CARE IF:  You havetrouble breathing, you become short of breath,or your skin ornails becomebluish.  You have severe painor stiffnessin the neck.  You develop a sudden headache, or pain in the face or ear.  You have nausea or vomiting that you cannot control. MAKE SURE YOU:   Understand these instructions.  Will watch your condition.  Will get help right away if you  are not doing well or get worse.   This information is not intended to replace advice given to you by your health care provider. Make sure you discuss any questions you have with your health care provider.   Document Released: 03/03/2000 Document Revised: 03/27/2014 Document Reviewed: 06/05/2011 Elsevier Interactive Patient Education 2016 [employer].  Syncope Syncope is a medical term for fainting or passing out. This means you lose consciousness and drop to the ground. People are generally unconscious for less than 5 minutes. You may have some muscle twitches for up to 15 seconds before waking up and returning to normal. Syncope occurs more often in older adults, but it can happen to anyone. While most causes of syncope are not dangerous, syncope can be a sign of a serious medical problem. It is important to seek medical care.  CAUSES  Syncope is caused by a sudden drop in blood flow to the brain. The specific cause is often not determined. Factors that can bring on syncope include:  Taking medicines that lower blood pressure.  Sudden changes in posture, such as standing up quickly.  Taking more medicine than prescribed.  Standing in one place for too long.  Seizure disorders.  Dehydration and excessive exposure to heat.  Low blood sugar (hypoglycemia).  Straining to have a bowel movement.  Heart disease, irregular heartbeat, or other circulatory problems.  Fear, emotional distress, seeing blood, or severe pain. SYMPTOMS  Right before fainting, you may:  Feel dizzy or light-headed.  Feel nauseous.  See all white or all black in your field of vision.  Have cold, clammy skin. DIAGNOSIS  Your health care provider will ask about your symptoms, perform a physical exam, and perform an electrocardiogram (ECG) to record the electrical activity of your heart. Your health care provider may also perform other heart or blood tests to determine the cause of your syncope which may  include:  Transthoracic echocardiogram (TTE). During echocardiography, sound waves are used to evaluate how blood flows through your heart.  Transesophageal echocardiogram (TEE).  Cardiac monitoring. This allows your health care provider to monitor your heart rate and rhythm in real time.  Holter monitor. This is a portable device that records your heartbeat and can help diagnose heart arrhythmias. It allows your health care provider to track your heart activity for several days, if needed.  Stress tests by exercise or by giving medicine that makes the heart beat faster. TREATMENT  In most cases, no treatment is needed. Depending on the cause of your syncope, your health care provider may recommend changing or stopping some of your medicines. HOME CARE INSTRUCTIONS  Have someone stay with you until you feel stable.  Do not drive, use machinery, or play sports until your health care provider says it is okay.  Keep all follow-up appointments as directed by your  health care provider.  Lie down right away if you start feeling like you might faint. Breathe deeply and steadily. Wait until all the symptoms have passed.  Drink enough fluids to keep your urine clear or pale yellow.  If you are taking blood pressure or heart medicine, get up slowly and take several minutes to sit and then stand. This can reduce dizziness. SEEK IMMEDIATE MEDICAL CARE IF:   You have a severe headache.  You have unusual pain in the chest, abdomen, or back.  You are bleeding from your mouth or rectum, or you have black or tarry stool.  You have an irregular or very fast heartbeat.  You have pain with breathing.  You have repeated fainting or seizure-like jerking during an episode.  You faint when sitting or lying down.  You have confusion.  You have trouble walking.  You have severe weakness.  You have vision problems. If you fainted, call your local emergency services (911 in [place] Do not drive  yourself to the hospital.    This information is not intended to replace advice given to you by your health care provider. Make sure you discuss any questions you have with your health care provider.   Document Released: 03/06/2005 Document Revised: 07/21/2014 Document Reviewed: 05/05/2011 Elsevier Interactive Patient Education [employer].

## 2015-05-20 NOTE — ED Provider Notes (Signed)
[facility] Emergency Department Provider Note  ____________________________________________  Time seen: 1:05 PM  I have reviewed the triage vital signs and the nursing notes.   HISTORY  Chief Complaint Loss of Consciousness    HPI Kenneth Hawkins is a 70 y.o. male who complains of nonproductive cough, congestion, malaise and decreased appetite for the past 4 days. He states he has not been eating or drinking anything in this time. Today when he was taking a hot shower, he became dizzy and passed out. He had back up and fell down again due to dizziness. Complains of some pain in the right buttock but no joint pain or bony pain. No other injuries per the patient. Denies any preceding chest pain shortness of breath abdominal pain back pain and headaches numbness tingling weakness or vision changes. None of those symptoms afterward either.     Past Medical History  Diagnosis Date  . CVA (cerebral vascular accident) ([place])   . Anemia   . Hypertension   . Hyperglycemia   . GERD (gastroesophageal reflux disease)   . BPH (benign prostatic hyperplasia)   . ED (erectile dysfunction)   . Prostatitis   . BPH with obstruction/lower urinary tract symptoms   . History of TIAs   . Peripheral vascular disease ([place])      There are no active problems to display for this patient.    Past Surgical History  Procedure Laterality Date  . None    . Colonoscopy with esophagogastroduodenoscopy (egd)    . Colonoscopy with propofol N/A 02/01/2015    Procedure: COLONOSCOPY WITH PROPOFOL;  Surgeon: Lollie Sails, MD;  Location: [facility] ENDOSCOPY;  Service: Endoscopy;  Laterality: N/A;     Current Outpatient Rx  Name  Route  Sig  Dispense  Refill  . aspirin EC 81 MG tablet   Oral   Take by mouth.         . finasteride (PROSCAR) 5 MG tablet   Oral   Take by mouth.         . finasteride (PROSCAR) 5 MG tablet   Oral   Take 1 tablet (5 mg total) by mouth daily.    90 tablet   3   . EXPIRED: losartan-hydrochlorothiazide (HYZAAR) 100-25 MG per tablet   Oral   Take by mouth.         Marland Kitchen omeprazole (PRILOSEC) 20 MG capsule      TAKE 1 CAPSULE EVERY DAY         . ondansetron (ZOFRAN ODT) 4 MG disintegrating tablet   Oral   Take 1 tablet (4 mg total) by mouth every 8 (eight) hours as needed for nausea or vomiting.   20 tablet   0   . ranitidine (ZANTAC) 150 MG capsule   Oral   Take 1 capsule (150 mg total) by mouth 2 (two) times daily.   28 capsule   0   . sildenafil (VIAGRA) 100 MG tablet   Oral   Take by mouth.         . sildenafil (VIAGRA) 100 MG tablet   Oral   Take 1 tablet (100 mg total) by mouth daily as needed for erectile dysfunction.   6 tablet   0   . sildenafil (VIAGRA) 100 MG tablet   Oral   Take 1 tablet (100 mg total) by mouth daily as needed for erectile dysfunction.   6 tablet   0   . simvastatin (ZOCOR) 80 MG tablet  TAKE 1 TABLET ONE TIME DAILY         . warfarin (COUMADIN) 3 MG tablet      TAKE 1 TABLET EVERY DAY            Allergies Ace inhibitors   History reviewed. No pertinent family history.  Social History Social History  Substance Use Topics  . Smoking status: Former Research scientist (life sciences)  . Smokeless tobacco: None  . Alcohol Use: No    Review of Systems  Constitutional:   No fever or chills. No weight changes Eyes:   No blurry vision or double vision.  ENT:   No sore throat.  Cardiovascular:   No chest pain. Respiratory:   No dyspnea, positive nonproductive cough Gastrointestinal:   Negative for abdominal pain, vomiting and diarrhea. Decreased appetite.  No BRBPR or melena. Genitourinary:   Negative for dysuria or difficulty urinating. Musculoskeletal:   Negative for back pain. No joint swelling or pain. Skin:   Negative for rash. Neurological:   Negative for headaches, focal weakness or numbness. Psychiatric:  No anxiety or depression.   Endocrine:  Generalized  weakness  10-point ROS otherwise negative.  ____________________________________________   PHYSICAL EXAM:  VITAL SIGNS: ED Triage Vitals  Enc Vitals Group     BP 05/20/15 1008 122/69 mmHg     Pulse Rate 05/20/15 1008 70     Resp 05/20/15 1008 18     Temp 05/20/15 1008 97.4 F (36.3 C)     Temp Source 05/20/15 1008 Oral     SpO2 05/20/15 1008 97 %     Weight 05/20/15 1008 190 lb (86.183 kg)     Height 05/20/15 1008 6' (1.829 m)     Head Cir --      Peak Flow --      Pain Score --      Pain Loc --      Pain Edu? --      Excl. in [place]? --     Vital signs reviewed, nursing assessments reviewed.   Constitutional:   Alert and oriented. Well appearing and in no distress. Eyes:   No scleral icterus. No conjunctival pallor. PERRL. EOMI ENT   Head:   Normocephalic and atraumatic.   Nose:   No congestion/rhinnorhea. No septal hematoma   Mouth/Throat:   Dry mucous membranes, no pharyngeal erythema. No peritonsillar mass.    Neck:   No stridor. No SubQ emphysema. No meningismus. Hematological/Lymphatic/Immunilogical:   No cervical lymphadenopathy. Cardiovascular:   RRR. Symmetric bilateral radial and DP pulses.  No murmurs.  Respiratory:   Normal respiratory effort without tachypnea nor retractions. Breath sounds are clear and equal bilaterally. No wheezes/rales/rhonchi. Gastrointestinal:   Soft and nontender. Non distended. There is no CVA tenderness.  No rebound, rigidity, or guarding. Genitourinary:   deferred Musculoskeletal:   Nontender with normal range of motion in all extremities. No joint effusions.  No lower extremity tenderness.  No edema. No midline spinal tenderness. Neurologic:   Normal speech and language.  CN 2-10 normal. Motor grossly intact. No gross focal neurologic deficits are appreciated.  Skin:    Skin is warm, dry and intact. No rash noted.  No petechiae, purpura, or bullae. Psychiatric:   Mood and affect are  normal. ____________________________________________    LABS (pertinent positives/negatives) (all labs ordered are listed, but only abnormal results are displayed) Labs Reviewed  BASIC METABOLIC PANEL - Abnormal; Notable for the following:    Chloride 98 (*)    Glucose, Bld 129 (*)  BUN 24 (*)    Creatinine, Ser 1.99 (*)    Calcium 8.4 (*)    GFR calc non Af Amer 33 (*)    GFR calc Af Amer 38 (*)    All other components within normal limits  PROTIME-INR - Abnormal; Notable for the following:    Prothrombin Time 21.7 (*)    All other components within normal limits  CBC  TROPONIN I  URINALYSIS COMPLETEWITH MICROSCOPIC ([facility] ONLY)  CBG MONITORING, ED   ____________________________________________   EKG  Interpreted by me  Date: 05/20/2015  Rate: 70  Rhythm: normal sinus rhythm  QRS Axis: normal  Intervals: normal  ST/T Wave abnormalities: normal  Conduction Disutrbances: none  Narrative Interpretation: unremarkable      ____________________________________________    RADIOLOGY  CT head unremarkable  ____________________________________________   PROCEDURES   ____________________________________________   INITIAL IMPRESSION / ASSESSMENT AND PLAN / ED COURSE  Pertinent labs & imaging results that were available during my care of the patient were reviewed by me and considered in my medical decision making (see chart for details).  Patient presents with syncope. Symptoms are highly consistent with viral illness, most likely influenza as this has been rampant in this community recently. Low suspicion for acute cardiopulmonary or vascular or neurologic phenomenon. CT head is negative, PT is slightly subtherapeutic. Low suspicion for a significant hemorrhagic complication. Patient rehydrated in the ED, we'll prescribe antacids and anti-medics, encourage IV fluids, follow-up with primary care.     ____________________________________________   FINAL  CLINICAL IMPRESSION(S) / ED DIAGNOSES  Final diagnoses:  Influenza-like illness  Dehydration  Syncope, unspecified syncope type      Carrie Mew, MD 05/20/15 1451

## 2015-06-10 DIAGNOSIS — Z8673 Personal history of transient ischemic attack (TIA), and cerebral infarction without residual deficits: Secondary | ICD-10-CM | POA: Diagnosis not present

## 2015-07-12 DIAGNOSIS — Z8673 Personal history of transient ischemic attack (TIA), and cerebral infarction without residual deficits: Secondary | ICD-10-CM | POA: Diagnosis not present

## 2015-08-11 DIAGNOSIS — Z8673 Personal history of transient ischemic attack (TIA), and cerebral infarction without residual deficits: Secondary | ICD-10-CM | POA: Diagnosis not present

## 2015-09-08 DIAGNOSIS — Z8673 Personal history of transient ischemic attack (TIA), and cerebral infarction without residual deficits: Secondary | ICD-10-CM | POA: Diagnosis not present

## 2015-10-15 DIAGNOSIS — I10 Essential (primary) hypertension: Secondary | ICD-10-CM | POA: Diagnosis not present

## 2015-10-15 DIAGNOSIS — R739 Hyperglycemia, unspecified: Secondary | ICD-10-CM | POA: Diagnosis not present

## 2015-10-15 DIAGNOSIS — Z125 Encounter for screening for malignant neoplasm of prostate: Secondary | ICD-10-CM | POA: Diagnosis not present

## 2015-10-15 DIAGNOSIS — I739 Peripheral vascular disease, unspecified: Secondary | ICD-10-CM | POA: Diagnosis not present

## 2015-10-22 DIAGNOSIS — E784 Other hyperlipidemia: Secondary | ICD-10-CM | POA: Diagnosis not present

## 2015-10-22 DIAGNOSIS — K219 Gastro-esophageal reflux disease without esophagitis: Secondary | ICD-10-CM | POA: Diagnosis not present

## 2015-10-22 DIAGNOSIS — I739 Peripheral vascular disease, unspecified: Secondary | ICD-10-CM | POA: Diagnosis not present

## 2015-10-22 DIAGNOSIS — D6489 Other specified anemias: Secondary | ICD-10-CM | POA: Diagnosis not present

## 2015-10-22 DIAGNOSIS — Z0000 Encounter for general adult medical examination without abnormal findings: Secondary | ICD-10-CM | POA: Diagnosis not present

## 2015-10-22 DIAGNOSIS — R739 Hyperglycemia, unspecified: Secondary | ICD-10-CM | POA: Diagnosis not present

## 2015-10-22 DIAGNOSIS — Z8673 Personal history of transient ischemic attack (TIA), and cerebral infarction without residual deficits: Secondary | ICD-10-CM | POA: Diagnosis not present

## 2015-10-22 DIAGNOSIS — Z87438 Personal history of other diseases of male genital organs: Secondary | ICD-10-CM | POA: Diagnosis not present

## 2015-10-22 DIAGNOSIS — I10 Essential (primary) hypertension: Secondary | ICD-10-CM | POA: Diagnosis not present

## 2015-11-16 DIAGNOSIS — Z8673 Personal history of transient ischemic attack (TIA), and cerebral infarction without residual deficits: Secondary | ICD-10-CM | POA: Diagnosis not present

## 2015-11-29 DIAGNOSIS — H905 Unspecified sensorineural hearing loss: Secondary | ICD-10-CM | POA: Diagnosis not present

## 2015-12-02 ENCOUNTER — Encounter: Payer: Self-pay | Admitting: Urology

## 2015-12-02 ENCOUNTER — Ambulatory Visit (INDEPENDENT_AMBULATORY_CARE_PROVIDER_SITE_OTHER): Payer: Commercial Managed Care - HMO | Admitting: Urology

## 2015-12-02 VITALS — BP 161/88 | HR 69 | Ht 72.0 in | Wt 197.3 lb

## 2015-12-02 DIAGNOSIS — N400 Enlarged prostate without lower urinary tract symptoms: Secondary | ICD-10-CM | POA: Diagnosis not present

## 2015-12-02 DIAGNOSIS — I739 Peripheral vascular disease, unspecified: Secondary | ICD-10-CM | POA: Insufficient documentation

## 2015-12-02 DIAGNOSIS — N529 Male erectile dysfunction, unspecified: Secondary | ICD-10-CM | POA: Diagnosis not present

## 2015-12-02 DIAGNOSIS — E785 Hyperlipidemia, unspecified: Secondary | ICD-10-CM | POA: Insufficient documentation

## 2015-12-02 DIAGNOSIS — I10 Essential (primary) hypertension: Secondary | ICD-10-CM | POA: Insufficient documentation

## 2015-12-02 DIAGNOSIS — R739 Hyperglycemia, unspecified: Secondary | ICD-10-CM | POA: Insufficient documentation

## 2015-12-02 DIAGNOSIS — D649 Anemia, unspecified: Secondary | ICD-10-CM | POA: Insufficient documentation

## 2015-12-02 DIAGNOSIS — Z125 Encounter for screening for malignant neoplasm of prostate: Secondary | ICD-10-CM | POA: Diagnosis not present

## 2015-12-02 DIAGNOSIS — K219 Gastro-esophageal reflux disease without esophagitis: Secondary | ICD-10-CM | POA: Insufficient documentation

## 2015-12-02 DIAGNOSIS — Z8673 Personal history of transient ischemic attack (TIA), and cerebral infarction without residual deficits: Secondary | ICD-10-CM | POA: Insufficient documentation

## 2015-12-02 MED ORDER — FINASTERIDE 5 MG PO TABS: 5.0000 mg | ORAL_TABLET | Freq: Every day | ORAL | 11 refills | Status: DC

## 2015-12-02 NOTE — Progress Notes (Signed)
12/02/2015 11:20 AM   Kenneth Hawkins 1946/01/26 [id number]  Referring provider: Adin Hector, MD [place], [place] [address]  Chief Complaint  Patient presents with  . Benign Prostatic Hypertrophy    Follow up    HPI: The patient is a 70 year old gentleman who presents for yearly follow-up.  1. BPH On finasteride. Symptoms well controlled. IPSS 0/0. No nocturia, frequency, intermittency, urgency, weak stream, straining. He does not get up at night to urinate. He is delighted with his urinary function.  2. Erectile dysfunction Has been on Viagra 100 mg in the past. Is currently not sexually active due to his wife's ovarian cancer.  3. Prostate cancer screening The patient is due for DRE/PSA today. His last PSA was 1.1 in March 2016.   PMH: Past Medical History:  Diagnosis Date  . Anemia   . BPH (benign prostatic hyperplasia)   . BPH with obstruction/lower urinary tract symptoms   . CVA (cerebral vascular accident) ([place])   . ED (erectile dysfunction)   . GERD (gastroesophageal reflux disease)   . History of TIAs   . Hyperglycemia   . Hypertension   . Peripheral vascular disease ([place])   . Prostatitis     Surgical History: Past Surgical History:  Procedure Laterality Date  . COLONOSCOPY WITH ESOPHAGOGASTRODUODENOSCOPY (EGD)    . COLONOSCOPY WITH PROPOFOL N/A 02/01/2015   Procedure: COLONOSCOPY WITH PROPOFOL;  Surgeon: Lollie Sails, MD;  Location: [facility] ENDOSCOPY;  Service: Endoscopy;  Laterality: N/A;  . none      Home Medications:    Medication List       Accurate as of 12/02/15 11:20 AM. Always use your most recent med list.          aspirin EC 81 MG tablet Take by mouth.   finasteride 5 MG tablet Commonly known as:  PROSCAR Take 1 tablet (5 mg total) by mouth daily.   HYZAAR 100-25 MG tablet Generic drug:  losartan-hydrochlorothiazide Take by mouth.   omeprazole 20 MG capsule Commonly known as:   PRILOSEC TAKE 1 CAPSULE EVERY DAY   ondansetron 4 MG disintegrating tablet Commonly known as:  ZOFRAN ODT Take 1 tablet (4 mg total) by mouth every 8 (eight) hours as needed for nausea or vomiting.   ranitidine 150 MG capsule Commonly known as:  ZANTAC Take 1 capsule (150 mg total) by mouth 2 (two) times daily.   sildenafil 100 MG tablet Commonly known as:  VIAGRA Take by mouth.   sildenafil 100 MG tablet Commonly known as:  VIAGRA Take 1 tablet (100 mg total) by mouth daily as needed for erectile dysfunction.   simvastatin 80 MG tablet Commonly known as:  ZOCOR TAKE 1 TABLET ONE TIME DAILY   warfarin 3 MG tablet Commonly known as:  COUMADIN TAKE 1 TABLET EVERY DAY       Allergies:  Allergies  Allergen Reactions  . Ace Inhibitors Cough    Family History: History reviewed. No pertinent family history.  Social History:  reports that he has quit smoking. He has never used smokeless tobacco. He reports that he does not drink alcohol or use drugs.  ROS: UROLOGY Frequent Urination?: No Hard to postpone urination?: No Burning/pain with urination?: No Get up at night to urinate?: No Leakage of urine?: No Urine stream starts and stops?: No Trouble starting stream?: No Do you have to strain to urinate?: No Blood in urine?: No Urinary tract infection?: No Sexually transmitted disease?:  No Injury to kidneys or bladder?: No Painful intercourse?: No Weak stream?: No Erection problems?: No Penile pain?: No  Gastrointestinal Nausea?: No Vomiting?: No Indigestion/heartburn?: No Diarrhea?: No Constipation?: No  Constitutional Fever: No Night sweats?: No Weight loss?: No Fatigue?: No  Skin Skin rash/lesions?: No Itching?: No  Eyes Blurred vision?: No Double vision?: No  Ears/Nose/Throat Sore throat?: No Sinus problems?: No  Hematologic/Lymphatic Swollen glands?: No Easy bruising?: No  Cardiovascular Leg swelling?: No Chest pain?:  No  Respiratory Cough?: No Shortness of breath?: No  Endocrine Excessive thirst?: No  Musculoskeletal Back pain?: No Joint pain?: No  Neurological Headaches?: No Dizziness?: No  Psychologic Depression?: No Anxiety?: No  Physical Exam: BP (!) 161/88 (BP Location: Left Arm, Patient Position: Sitting, Cuff Size: Normal)   Pulse 69   Ht 6' (1.829 m)   Wt 197 lb 4.8 oz (89.5 kg)   BMI 26.76 kg/m   Constitutional:  Alert and oriented, No acute distress. HEENT: [place] AT, moist mucus membranes.  Trachea midline, no masses. Cardiovascular: No clubbing, cyanosis, or edema. Respiratory: Normal respiratory effort, no increased work of breathing. GI: Abdomen is soft, nontender, nondistended, no abdominal masses GU: No CVA tenderness. DRE: 2+ benign. Skin: No rashes, bruises or suspicious lesions. Lymph: No cervical or inguinal adenopathy. Neurologic: Grossly intact, no focal deficits, moving all 4 extremities. Psychiatric: Normal mood and affect.  Laboratory Data: Lab Results  Component Value Date   WBC 6.9 05/20/2015   HGB 14.8 05/20/2015   HCT 44.0 05/20/2015   MCV 86.6 05/20/2015   PLT 178 05/20/2015    Lab Results  Component Value Date   CREATININE 1.99 (H) 05/20/2015    No results found for: PSA  No results found for: TESTOSTERONE  No results found for: HGBA1C  Urinalysis    Component Value Date/Time   APPEARANCEUR Clear 12/01/2014 1552   GLUCOSEU Negative 12/01/2014 1552   BILIRUBINUR Negative 12/01/2014 1552   PROTEINUR Negative 12/01/2014 1552   NITRITE Negative 12/01/2014 1552   LEUKOCYTESUR Negative 12/01/2014 1552    Assessment & Plan:    1. BPH -continue finasteride  2. Erectile dysfunction -Currently not sexually active due to his wife's cancer. He will call the office if he does want to get back on Viagra 100 mg. We will prescribe as this medication that time.  3. Prostate cancer screening -check PSA today. Will need to take into account  the patient is on finasteride.  No Follow-up on file.  Nickie Retort, MD  [facility] [address], [place], [place] [address] [phone]

## 2015-12-03 ENCOUNTER — Ambulatory Visit: Payer: Commercial Managed Care - HMO

## 2015-12-03 LAB — PSA TOTAL (REFLEX TO FREE): Prostate Specific Ag, Serum: 1.1 ng/mL (ref 0.0–4.0)

## 2015-12-16 DIAGNOSIS — Z8673 Personal history of transient ischemic attack (TIA), and cerebral infarction without residual deficits: Secondary | ICD-10-CM | POA: Diagnosis not present

## 2015-12-17 ENCOUNTER — Telehealth: Payer: Self-pay

## 2015-12-17 NOTE — Telephone Encounter (Signed)
Please let patient know his PSA is normal and the same as last year - Dr. Bernita Buffy with pt in reference to PSA results. Pt voiced understanding.

## 2016-01-17 DIAGNOSIS — Z8673 Personal history of transient ischemic attack (TIA), and cerebral infarction without residual deficits: Secondary | ICD-10-CM | POA: Diagnosis not present

## 2016-02-04 DIAGNOSIS — Z8673 Personal history of transient ischemic attack (TIA), and cerebral infarction without residual deficits: Secondary | ICD-10-CM | POA: Diagnosis not present

## 2016-02-04 DIAGNOSIS — I739 Peripheral vascular disease, unspecified: Secondary | ICD-10-CM | POA: Diagnosis not present

## 2016-02-04 DIAGNOSIS — I10 Essential (primary) hypertension: Secondary | ICD-10-CM | POA: Diagnosis not present

## 2016-02-04 DIAGNOSIS — E784 Other hyperlipidemia: Secondary | ICD-10-CM | POA: Diagnosis not present

## 2016-02-04 DIAGNOSIS — R739 Hyperglycemia, unspecified: Secondary | ICD-10-CM | POA: Diagnosis not present

## 2016-02-18 DIAGNOSIS — K219 Gastro-esophageal reflux disease without esophagitis: Secondary | ICD-10-CM | POA: Diagnosis not present

## 2016-02-18 DIAGNOSIS — E784 Other hyperlipidemia: Secondary | ICD-10-CM | POA: Diagnosis not present

## 2016-02-18 DIAGNOSIS — D649 Anemia, unspecified: Secondary | ICD-10-CM | POA: Diagnosis not present

## 2016-02-18 DIAGNOSIS — I10 Essential (primary) hypertension: Secondary | ICD-10-CM | POA: Diagnosis not present

## 2016-02-18 DIAGNOSIS — Z8673 Personal history of transient ischemic attack (TIA), and cerebral infarction without residual deficits: Secondary | ICD-10-CM | POA: Diagnosis not present

## 2016-02-18 DIAGNOSIS — R739 Hyperglycemia, unspecified: Secondary | ICD-10-CM | POA: Diagnosis not present

## 2016-02-18 DIAGNOSIS — I739 Peripheral vascular disease, unspecified: Secondary | ICD-10-CM | POA: Diagnosis not present

## 2016-02-22 DIAGNOSIS — Z8673 Personal history of transient ischemic attack (TIA), and cerebral infarction without residual deficits: Secondary | ICD-10-CM | POA: Diagnosis not present

## 2016-03-02 DIAGNOSIS — E784 Other hyperlipidemia: Secondary | ICD-10-CM | POA: Diagnosis not present

## 2016-03-02 DIAGNOSIS — I10 Essential (primary) hypertension: Secondary | ICD-10-CM | POA: Diagnosis not present

## 2016-03-02 DIAGNOSIS — I739 Peripheral vascular disease, unspecified: Secondary | ICD-10-CM | POA: Diagnosis not present

## 2016-03-02 DIAGNOSIS — R739 Hyperglycemia, unspecified: Secondary | ICD-10-CM | POA: Diagnosis not present

## 2016-03-15 ENCOUNTER — Encounter (INDEPENDENT_AMBULATORY_CARE_PROVIDER_SITE_OTHER): Payer: Self-pay

## 2016-03-15 ENCOUNTER — Encounter (INDEPENDENT_AMBULATORY_CARE_PROVIDER_SITE_OTHER): Payer: Commercial Managed Care - HMO

## 2016-03-15 ENCOUNTER — Other Ambulatory Visit (INDEPENDENT_AMBULATORY_CARE_PROVIDER_SITE_OTHER): Payer: Self-pay | Admitting: Vascular Surgery

## 2016-03-15 ENCOUNTER — Other Ambulatory Visit (INDEPENDENT_AMBULATORY_CARE_PROVIDER_SITE_OTHER): Payer: Self-pay | Admitting: Internal Medicine

## 2016-03-15 DIAGNOSIS — I10 Essential (primary) hypertension: Secondary | ICD-10-CM | POA: Diagnosis not present

## 2016-03-21 DIAGNOSIS — Z8673 Personal history of transient ischemic attack (TIA), and cerebral infarction without residual deficits: Secondary | ICD-10-CM | POA: Diagnosis not present

## 2016-04-17 DIAGNOSIS — I10 Essential (primary) hypertension: Secondary | ICD-10-CM | POA: Diagnosis not present

## 2016-04-17 DIAGNOSIS — R739 Hyperglycemia, unspecified: Secondary | ICD-10-CM | POA: Diagnosis not present

## 2016-04-17 DIAGNOSIS — Z8673 Personal history of transient ischemic attack (TIA), and cerebral infarction without residual deficits: Secondary | ICD-10-CM | POA: Diagnosis not present

## 2016-04-17 DIAGNOSIS — I739 Peripheral vascular disease, unspecified: Secondary | ICD-10-CM | POA: Diagnosis not present

## 2016-04-24 DIAGNOSIS — I10 Essential (primary) hypertension: Secondary | ICD-10-CM | POA: Diagnosis not present

## 2016-04-24 DIAGNOSIS — E784 Other hyperlipidemia: Secondary | ICD-10-CM | POA: Diagnosis not present

## 2016-04-24 DIAGNOSIS — R739 Hyperglycemia, unspecified: Secondary | ICD-10-CM | POA: Diagnosis not present

## 2016-04-24 DIAGNOSIS — I739 Peripheral vascular disease, unspecified: Secondary | ICD-10-CM | POA: Diagnosis not present

## 2016-04-24 DIAGNOSIS — D649 Anemia, unspecified: Secondary | ICD-10-CM | POA: Diagnosis not present

## 2016-04-24 DIAGNOSIS — K219 Gastro-esophageal reflux disease without esophagitis: Secondary | ICD-10-CM | POA: Diagnosis not present

## 2016-05-15 DIAGNOSIS — Z8673 Personal history of transient ischemic attack (TIA), and cerebral infarction without residual deficits: Secondary | ICD-10-CM | POA: Diagnosis not present

## 2016-05-22 DIAGNOSIS — E784 Other hyperlipidemia: Secondary | ICD-10-CM | POA: Diagnosis not present

## 2016-05-22 DIAGNOSIS — I10 Essential (primary) hypertension: Secondary | ICD-10-CM | POA: Diagnosis not present

## 2016-05-22 DIAGNOSIS — R739 Hyperglycemia, unspecified: Secondary | ICD-10-CM | POA: Diagnosis not present

## 2016-05-22 DIAGNOSIS — D649 Anemia, unspecified: Secondary | ICD-10-CM | POA: Diagnosis not present

## 2016-05-22 DIAGNOSIS — K219 Gastro-esophageal reflux disease without esophagitis: Secondary | ICD-10-CM | POA: Diagnosis not present

## 2016-05-22 DIAGNOSIS — I739 Peripheral vascular disease, unspecified: Secondary | ICD-10-CM | POA: Diagnosis not present

## 2016-06-13 DIAGNOSIS — Z8673 Personal history of transient ischemic attack (TIA), and cerebral infarction without residual deficits: Secondary | ICD-10-CM | POA: Diagnosis not present

## 2016-06-21 ENCOUNTER — Other Ambulatory Visit: Payer: Self-pay | Admitting: Internal Medicine

## 2016-06-21 DIAGNOSIS — R600 Localized edema: Secondary | ICD-10-CM

## 2016-06-21 DIAGNOSIS — D649 Anemia, unspecified: Secondary | ICD-10-CM | POA: Diagnosis not present

## 2016-06-21 DIAGNOSIS — I739 Peripheral vascular disease, unspecified: Secondary | ICD-10-CM | POA: Diagnosis not present

## 2016-06-21 DIAGNOSIS — R739 Hyperglycemia, unspecified: Secondary | ICD-10-CM | POA: Diagnosis not present

## 2016-06-21 DIAGNOSIS — E784 Other hyperlipidemia: Secondary | ICD-10-CM | POA: Diagnosis not present

## 2016-06-21 DIAGNOSIS — I10 Essential (primary) hypertension: Secondary | ICD-10-CM | POA: Diagnosis not present

## 2016-06-26 DIAGNOSIS — R600 Localized edema: Secondary | ICD-10-CM | POA: Diagnosis not present

## 2016-06-27 ENCOUNTER — Ambulatory Visit
Admission: RE | Admit: 2016-06-27 | Discharge: 2016-06-27 | Disposition: A | Discharge status: Home / Self Care | Payer: Medicare HMO | Source: Ambulatory Visit | Attending: Internal Medicine | Admitting: Internal Medicine

## 2016-06-27 DIAGNOSIS — R600 Localized edema: Secondary | ICD-10-CM

## 2016-07-05 DIAGNOSIS — I10 Essential (primary) hypertension: Secondary | ICD-10-CM | POA: Diagnosis not present

## 2016-07-05 DIAGNOSIS — I739 Peripheral vascular disease, unspecified: Secondary | ICD-10-CM | POA: Diagnosis not present

## 2016-07-12 DIAGNOSIS — Z8673 Personal history of transient ischemic attack (TIA), and cerebral infarction without residual deficits: Secondary | ICD-10-CM | POA: Diagnosis not present

## 2016-08-15 DIAGNOSIS — I10 Essential (primary) hypertension: Secondary | ICD-10-CM | POA: Diagnosis not present

## 2016-08-15 DIAGNOSIS — I739 Peripheral vascular disease, unspecified: Secondary | ICD-10-CM | POA: Diagnosis not present

## 2016-08-15 DIAGNOSIS — D649 Anemia, unspecified: Secondary | ICD-10-CM | POA: Diagnosis not present

## 2016-08-22 DIAGNOSIS — E784 Other hyperlipidemia: Secondary | ICD-10-CM | POA: Diagnosis not present

## 2016-08-22 DIAGNOSIS — K219 Gastro-esophageal reflux disease without esophagitis: Secondary | ICD-10-CM | POA: Diagnosis not present

## 2016-08-22 DIAGNOSIS — D649 Anemia, unspecified: Secondary | ICD-10-CM | POA: Diagnosis not present

## 2016-08-22 DIAGNOSIS — R739 Hyperglycemia, unspecified: Secondary | ICD-10-CM | POA: Diagnosis not present

## 2016-08-22 DIAGNOSIS — I10 Essential (primary) hypertension: Secondary | ICD-10-CM | POA: Diagnosis not present

## 2016-08-22 DIAGNOSIS — I739 Peripheral vascular disease, unspecified: Secondary | ICD-10-CM | POA: Diagnosis not present

## 2016-08-22 DIAGNOSIS — R600 Localized edema: Secondary | ICD-10-CM | POA: Diagnosis not present

## 2016-09-18 DIAGNOSIS — D649 Anemia, unspecified: Secondary | ICD-10-CM | POA: Diagnosis not present

## 2016-09-18 DIAGNOSIS — I739 Peripheral vascular disease, unspecified: Secondary | ICD-10-CM | POA: Diagnosis not present

## 2016-09-18 DIAGNOSIS — Z8673 Personal history of transient ischemic attack (TIA), and cerebral infarction without residual deficits: Secondary | ICD-10-CM | POA: Diagnosis not present

## 2016-09-25 DIAGNOSIS — K219 Gastro-esophageal reflux disease without esophagitis: Secondary | ICD-10-CM | POA: Diagnosis not present

## 2016-09-25 DIAGNOSIS — R739 Hyperglycemia, unspecified: Secondary | ICD-10-CM | POA: Diagnosis not present

## 2016-09-25 DIAGNOSIS — D649 Anemia, unspecified: Secondary | ICD-10-CM | POA: Diagnosis not present

## 2016-09-25 DIAGNOSIS — Z8673 Personal history of transient ischemic attack (TIA), and cerebral infarction without residual deficits: Secondary | ICD-10-CM | POA: Diagnosis not present

## 2016-09-25 DIAGNOSIS — E784 Other hyperlipidemia: Secondary | ICD-10-CM | POA: Diagnosis not present

## 2016-09-25 DIAGNOSIS — I739 Peripheral vascular disease, unspecified: Secondary | ICD-10-CM | POA: Diagnosis not present

## 2016-09-25 DIAGNOSIS — I10 Essential (primary) hypertension: Secondary | ICD-10-CM | POA: Diagnosis not present

## 2016-09-25 DIAGNOSIS — R600 Localized edema: Secondary | ICD-10-CM | POA: Diagnosis not present

## 2016-09-25 DIAGNOSIS — Z125 Encounter for screening for malignant neoplasm of prostate: Secondary | ICD-10-CM | POA: Diagnosis not present

## 2016-10-23 DIAGNOSIS — Z8673 Personal history of transient ischemic attack (TIA), and cerebral infarction without residual deficits: Secondary | ICD-10-CM | POA: Diagnosis not present

## 2016-11-22 DIAGNOSIS — Z8673 Personal history of transient ischemic attack (TIA), and cerebral infarction without residual deficits: Secondary | ICD-10-CM | POA: Diagnosis not present

## 2016-12-01 ENCOUNTER — Encounter: Payer: Self-pay | Admitting: Urology

## 2016-12-01 ENCOUNTER — Ambulatory Visit: Payer: Medicare HMO | Admitting: Urology

## 2016-12-01 ENCOUNTER — Ambulatory Visit: Payer: Self-pay

## 2016-12-01 VITALS — BP 168/81 | HR 58 | Ht 72.0 in | Wt 196.6 lb

## 2016-12-01 DIAGNOSIS — N400 Enlarged prostate without lower urinary tract symptoms: Secondary | ICD-10-CM

## 2016-12-01 DIAGNOSIS — Z125 Encounter for screening for malignant neoplasm of prostate: Secondary | ICD-10-CM | POA: Diagnosis not present

## 2016-12-01 DIAGNOSIS — N529 Male erectile dysfunction, unspecified: Secondary | ICD-10-CM | POA: Diagnosis not present

## 2016-12-01 MED ORDER — SILDENAFIL CITRATE 20 MG PO TABS: ORAL_TABLET | ORAL | 3 refills | Status: DC

## 2016-12-01 MED ORDER — FINASTERIDE 5 MG PO TABS
5.0000 mg | ORAL_TABLET | Freq: Every day | ORAL | 3 refills | Status: DC
Start: 2016-12-01 — End: 2017-12-06

## 2016-12-01 NOTE — Progress Notes (Signed)
12/01/2016 10:58 AM   Kenneth Hawkins [birth year] [id number]  Referring provider: Adin Hector, MD [facility] [place], [place] [address]  Chief Complaint  Patient presents with  . Benign Prostatic Hypertrophy    1 year follow up   . Erectile Dysfunction    HPI: The patient is a 71 year old gentleman who presents for yearly follow-up.  1. BPH On finasteride. Symptoms well controlled. IPSS 0/0. No nocturia, frequency, intermittency, urgency, weak stream, straining. He does not get up at night to urinate. He is delighted with his urinary function.  2. Erectile dysfunction Has been on Viagra 100 mg in the past.This worked well for him but was expensive. He is interested in trying generic sildenafil. Does not take nitrates. No cardiac history.  3. Prostate cancer screening The patient is due for DRE/PSA today. His last PSA was 1.1 in both March 2016 and September 2017.     PMH: Past Medical History:  Diagnosis Date  . Anemia   . BPH (benign prostatic hyperplasia)   . BPH with obstruction/lower urinary tract symptoms   . CVA (cerebral vascular accident) ([place])   . ED (erectile dysfunction)   . GERD (gastroesophageal reflux disease)   . History of TIAs   . Hyperglycemia   . Hypertension   . Peripheral vascular disease ([place])   . Prostatitis     Surgical History: Past Surgical History:  Procedure Laterality Date  . COLONOSCOPY WITH ESOPHAGOGASTRODUODENOSCOPY (EGD)    . COLONOSCOPY WITH PROPOFOL N/A 02/01/2015   Procedure: COLONOSCOPY WITH PROPOFOL;  Surgeon: Lollie Sails, MD;  Location: [facility] ENDOSCOPY;  Service: Endoscopy;  Laterality: N/A;    Home Medications:  Allergies as of 12/01/2016      Reactions   Ace Inhibitors Cough      Medication List       Accurate as of 12/01/16 10:58 AM. Always use your most recent med list.          amLODipine 5 MG tablet Commonly known as:  NORVASC Take by mouth.   aspirin EC 81 MG  tablet Take by mouth.   atorvastatin 80 MG tablet Commonly known as:  LIPITOR Take by mouth.   finasteride 5 MG tablet Commonly known as:  PROSCAR Take 1 tablet (5 mg total) by mouth daily.   finasteride 5 MG tablet Commonly known as:  PROSCAR Take 1 tablet (5 mg total) by mouth daily.   furosemide 40 MG tablet Commonly known as:  LASIX Take by mouth.   HYZAAR 100-25 MG tablet Generic drug:  losartan-hydrochlorothiazide Take by mouth.   metoprolol tartrate 25 MG tablet Commonly known as:  LOPRESSOR Take by mouth.   omeprazole 20 MG capsule Commonly known as:  PRILOSEC TAKE 1 CAPSULE EVERY DAY   ondansetron 4 MG disintegrating tablet Commonly known as:  ZOFRAN ODT Take 1 tablet (4 mg total) by mouth every 8 (eight) hours as needed for nausea or vomiting.   potassium chloride 10 MEQ tablet Commonly known as:  K-DUR Take by mouth.   ranitidine 150 MG capsule Commonly known as:  ZANTAC Take 1 capsule (150 mg total) by mouth 2 (two) times daily.   sildenafil 100 MG tablet Commonly known as:  VIAGRA Take by mouth.   sildenafil 100 MG tablet Commonly known as:  VIAGRA Take 1 tablet (100 mg total) by mouth daily as needed for erectile dysfunction.   simvastatin 80 MG tablet Commonly known as:  ZOCOR TAKE 1 TABLET ONE  TIME DAILY   warfarin 3 MG tablet Commonly known as:  COUMADIN TAKE 1 TABLET EVERY DAY            Discharge Care Instructions        Start     Ordered   12/01/16 0000  PSA     12/01/16 1057      Allergies:  Allergies  Allergen Reactions  . Ace Inhibitors Cough    Family History: Family History  Problem Relation Age of Onset  . Kidney cancer Neg Hx   . Bladder Cancer Neg Hx   . Prostate cancer Neg Hx     Social History:  reports that he quit smoking about 13 years ago. He has never used smokeless tobacco. He reports that he does not drink alcohol or use drugs.  ROS: UROLOGY Frequent Urination?: No Hard to postpone  urination?: No Burning/pain with urination?: No Get up at night to urinate?: No Leakage of urine?: No Urine stream starts and stops?: No Trouble starting stream?: No Do you have to strain to urinate?: No Blood in urine?: No Urinary tract infection?: No Sexually transmitted disease?: No Injury to kidneys or bladder?: No Painful intercourse?: No Weak stream?: No Erection problems?: No Penile pain?: No  Gastrointestinal Nausea?: No Vomiting?: No Indigestion/heartburn?: No Diarrhea?: No Constipation?: No  Constitutional Fever: No Night sweats?: No Weight loss?: No Fatigue?: No  Skin Skin rash/lesions?: No Itching?: No  Eyes Blurred vision?: No Double vision?: No  Ears/Nose/Throat Sore throat?: No Sinus problems?: No  Hematologic/Lymphatic Swollen glands?: No Easy bruising?: No  Cardiovascular Leg swelling?: No Chest pain?: No  Respiratory Cough?: No Shortness of breath?: No  Endocrine Excessive thirst?: No  Musculoskeletal Back pain?: No Joint pain?: No  Neurological Headaches?: No Dizziness?: No  Psychologic Depression?: No Anxiety?: No  Physical Exam: BP (!) 168/81   Pulse (!) 58   Ht 6' (1.829 m)   Wt 196 lb 9.6 oz (89.2 kg)   BMI 26.66 kg/m   Constitutional:  Alert and oriented, No acute distress. HEENT: [place] AT, moist mucus membranes.  Trachea midline, no masses. Cardiovascular: No clubbing, cyanosis, or edema. Respiratory: Normal respiratory effort, no increased work of breathing. GI: Abdomen is soft, nontender, nondistended, no abdominal masses GU: No CVA tenderness. DRE 2+ Benign. Skin: No rashes, bruises or suspicious lesions. Lymph: No cervical or inguinal adenopathy. Neurologic: Grossly intact, no focal deficits, moving all 4 extremities. Psychiatric: Normal mood and affect.  Laboratory Data: Lab Results  Component Value Date   WBC 6.9 05/20/2015   HGB 14.8 05/20/2015   HCT 44.0 05/20/2015   MCV 86.6 05/20/2015   PLT  178 05/20/2015    Lab Results  Component Value Date   CREATININE 1.99 (H) 05/20/2015    No results found for: PSA  No results found for: TESTOSTERONE  No results found for: HGBA1C  Urinalysis    Component Value Date/Time   APPEARANCEUR Clear 12/01/2014 1552   GLUCOSEU Negative 12/01/2014 1552   BILIRUBINUR Negative 12/01/2014 1552   PROTEINUR Negative 12/01/2014 1552   NITRITE Negative 12/01/2014 1552   LEUKOCYTESUR Negative 12/01/2014 1552     Assessment & Plan:    1. BPH -continue finasteride  2. Erectile dysfunction -Will try generic sildenafil 20 mg 5 tabs by mouth daily as needed.  3. Prostate cancer screening -check PSA today. Will need to take into account that the patient is on finasteride.  Return in about 1 year (around 12/01/2017).  Nickie Retort, MD  [facility] [address] [place],  [address] [phone]

## 2016-12-02 LAB — PSA: Prostate Specific Ag, Serum: 1 ng/mL (ref 0.0–4.0)

## 2016-12-12 DIAGNOSIS — I739 Peripheral vascular disease, unspecified: Secondary | ICD-10-CM | POA: Diagnosis not present

## 2016-12-12 DIAGNOSIS — Z125 Encounter for screening for malignant neoplasm of prostate: Secondary | ICD-10-CM | POA: Diagnosis not present

## 2016-12-12 DIAGNOSIS — R739 Hyperglycemia, unspecified: Secondary | ICD-10-CM | POA: Diagnosis not present

## 2016-12-12 DIAGNOSIS — E784 Other hyperlipidemia: Secondary | ICD-10-CM | POA: Diagnosis not present

## 2016-12-12 DIAGNOSIS — I10 Essential (primary) hypertension: Secondary | ICD-10-CM | POA: Diagnosis not present

## 2016-12-12 DIAGNOSIS — D649 Anemia, unspecified: Secondary | ICD-10-CM | POA: Diagnosis not present

## 2016-12-14 ENCOUNTER — Telehealth: Payer: Self-pay

## 2016-12-14 NOTE — Telephone Encounter (Signed)
Budzyn, Brian James, MD  Watkins, Chelsea C, LPN        Please let patient know PSA is normal. Thanks.   Spoke with pt in reference to PSA results. Pt voiced understanding.  

## 2016-12-19 DIAGNOSIS — R600 Localized edema: Secondary | ICD-10-CM | POA: Diagnosis not present

## 2016-12-19 DIAGNOSIS — I739 Peripheral vascular disease, unspecified: Secondary | ICD-10-CM | POA: Diagnosis not present

## 2016-12-19 DIAGNOSIS — D649 Anemia, unspecified: Secondary | ICD-10-CM | POA: Diagnosis not present

## 2016-12-19 DIAGNOSIS — E7849 Other hyperlipidemia: Secondary | ICD-10-CM | POA: Diagnosis not present

## 2016-12-19 DIAGNOSIS — I10 Essential (primary) hypertension: Secondary | ICD-10-CM | POA: Diagnosis not present

## 2016-12-19 DIAGNOSIS — R739 Hyperglycemia, unspecified: Secondary | ICD-10-CM | POA: Diagnosis not present

## 2016-12-19 DIAGNOSIS — K219 Gastro-esophageal reflux disease without esophagitis: Secondary | ICD-10-CM | POA: Diagnosis not present

## 2016-12-19 DIAGNOSIS — Z0000 Encounter for general adult medical examination without abnormal findings: Secondary | ICD-10-CM | POA: Diagnosis not present

## 2017-01-16 DIAGNOSIS — Z8673 Personal history of transient ischemic attack (TIA), and cerebral infarction without residual deficits: Secondary | ICD-10-CM | POA: Diagnosis not present

## 2017-02-20 DIAGNOSIS — Z8673 Personal history of transient ischemic attack (TIA), and cerebral infarction without residual deficits: Secondary | ICD-10-CM | POA: Diagnosis not present

## 2017-03-21 DIAGNOSIS — Z8673 Personal history of transient ischemic attack (TIA), and cerebral infarction without residual deficits: Secondary | ICD-10-CM | POA: Diagnosis not present

## 2017-04-18 DIAGNOSIS — Z8673 Personal history of transient ischemic attack (TIA), and cerebral infarction without residual deficits: Secondary | ICD-10-CM | POA: Diagnosis not present

## 2017-05-17 DIAGNOSIS — Z8673 Personal history of transient ischemic attack (TIA), and cerebral infarction without residual deficits: Secondary | ICD-10-CM | POA: Diagnosis not present

## 2017-05-23 DIAGNOSIS — H5213 Myopia, bilateral: Secondary | ICD-10-CM | POA: Diagnosis not present

## 2017-06-12 DIAGNOSIS — E7849 Other hyperlipidemia: Secondary | ICD-10-CM | POA: Diagnosis not present

## 2017-06-12 DIAGNOSIS — I739 Peripheral vascular disease, unspecified: Secondary | ICD-10-CM | POA: Diagnosis not present

## 2017-06-12 DIAGNOSIS — I10 Essential (primary) hypertension: Secondary | ICD-10-CM | POA: Diagnosis not present

## 2017-06-12 DIAGNOSIS — R739 Hyperglycemia, unspecified: Secondary | ICD-10-CM | POA: Diagnosis not present

## 2017-06-12 DIAGNOSIS — D649 Anemia, unspecified: Secondary | ICD-10-CM | POA: Diagnosis not present

## 2017-06-19 DIAGNOSIS — Z8673 Personal history of transient ischemic attack (TIA), and cerebral infarction without residual deficits: Secondary | ICD-10-CM | POA: Diagnosis not present

## 2017-06-19 DIAGNOSIS — I10 Essential (primary) hypertension: Secondary | ICD-10-CM | POA: Diagnosis not present

## 2017-06-19 DIAGNOSIS — D649 Anemia, unspecified: Secondary | ICD-10-CM | POA: Diagnosis not present

## 2017-06-19 DIAGNOSIS — Z125 Encounter for screening for malignant neoplasm of prostate: Secondary | ICD-10-CM | POA: Diagnosis not present

## 2017-06-19 DIAGNOSIS — K219 Gastro-esophageal reflux disease without esophagitis: Secondary | ICD-10-CM | POA: Diagnosis not present

## 2017-06-19 DIAGNOSIS — I739 Peripheral vascular disease, unspecified: Secondary | ICD-10-CM | POA: Diagnosis not present

## 2017-06-19 DIAGNOSIS — R600 Localized edema: Secondary | ICD-10-CM | POA: Diagnosis not present

## 2017-06-19 DIAGNOSIS — E7849 Other hyperlipidemia: Secondary | ICD-10-CM | POA: Diagnosis not present

## 2017-06-19 DIAGNOSIS — R739 Hyperglycemia, unspecified: Secondary | ICD-10-CM | POA: Diagnosis not present

## 2017-07-17 DIAGNOSIS — Z8673 Personal history of transient ischemic attack (TIA), and cerebral infarction without residual deficits: Secondary | ICD-10-CM | POA: Diagnosis not present

## 2017-08-16 DIAGNOSIS — Z8673 Personal history of transient ischemic attack (TIA), and cerebral infarction without residual deficits: Secondary | ICD-10-CM | POA: Diagnosis not present

## 2017-09-14 DIAGNOSIS — Z8673 Personal history of transient ischemic attack (TIA), and cerebral infarction without residual deficits: Secondary | ICD-10-CM | POA: Diagnosis not present

## 2017-10-16 DIAGNOSIS — Z8673 Personal history of transient ischemic attack (TIA), and cerebral infarction without residual deficits: Secondary | ICD-10-CM | POA: Diagnosis not present

## 2017-11-16 DIAGNOSIS — Z8673 Personal history of transient ischemic attack (TIA), and cerebral infarction without residual deficits: Secondary | ICD-10-CM | POA: Diagnosis not present

## 2017-12-06 ENCOUNTER — Encounter: Payer: Self-pay | Admitting: Urology

## 2017-12-06 ENCOUNTER — Other Ambulatory Visit: Payer: Self-pay

## 2017-12-06 ENCOUNTER — Ambulatory Visit: Payer: Medicare HMO | Admitting: Urology

## 2017-12-06 VITALS — BP 162/90 | HR 61 | Ht 72.0 in | Wt 193.2 lb

## 2017-12-06 DIAGNOSIS — N400 Enlarged prostate without lower urinary tract symptoms: Secondary | ICD-10-CM

## 2017-12-06 DIAGNOSIS — N529 Male erectile dysfunction, unspecified: Secondary | ICD-10-CM

## 2017-12-06 MED ORDER — TADALAFIL 20 MG PO TABS: 20.0000 mg | ORAL_TABLET | Freq: Every day | ORAL | 11 refills | Status: DC | PRN

## 2017-12-06 MED ORDER — FINASTERIDE 5 MG PO TABS: 5.0000 mg | ORAL_TABLET | Freq: Every day | ORAL | 3 refills | Status: DC

## 2017-12-06 NOTE — Progress Notes (Signed)
   12/06/2017 1:40 PM   Kenneth Hawkins 1946/01/15 [id number]  Reason for visit: Follow up BPH/ED  HPI: I had the pleasure of seeing Kenneth Hawkins in urology clinic today in follow-up for BPH and erectile dysfunction.  He was previously followed by Dr. Pilar Jarvis.  Regarding his BPH, he is very well managed on finasteride and has minimal urinary complaints today.  He denies any nocturia.  Last PSA was 1.0 in September 2018 on finasteride, corrected value 2.0  Regarding his erectile dysfunction he is able to get erections with 100 mg of sildenafil.  He is not on any nitrates.   ROS: Please see flowsheet from today's date for complete review of systems.  Physical Exam: BP (!) 162/90   Pulse 61   Ht 6' (1.829 m)   Wt 193 lb 3.2 oz (87.6 kg)   BMI 26.20 kg/m    Constitutional:  Alert and oriented, No acute distress. Respiratory: Normal respiratory effort, no increased work of breathing. GI: Abdomen is soft, nontender, nondistended, no abdominal masses GU: No CVA tenderness Skin: No rashes, bruises or suspicious lesions. Neurologic: Grossly intact, no focal deficits, moving all 4 extremities. Psychiatric: Normal mood and affect  Assessment & Plan:   In summary, Kenneth Hawkins is a 72 year old male with well managed BPH on finasteride, and well-managed erectile dysfunction on sildenafil.  We discussed AUA recommendations to discontinue PSA screening in men over the age of 7, and he is in agreement.   1. BPH (benign prostatic hyperplasia) Continue finasteride, this was refilled today  2. Erectile dysfunction, unspecified erectile dysfunction type He would like to try tadalafil secondary to the extended half-life and the fact that it can be taken during meals.  Trial of 20 mg tadalafil.  GoodRx.com coupon provided.   Return in about 1 year (around 12/07/2018) for discuss BPH/ED.  Billey Co, [facility] [address], [place], [place]  [address] [phone]

## 2017-12-17 DIAGNOSIS — I10 Essential (primary) hypertension: Secondary | ICD-10-CM | POA: Diagnosis not present

## 2017-12-17 DIAGNOSIS — D649 Anemia, unspecified: Secondary | ICD-10-CM | POA: Diagnosis not present

## 2017-12-17 DIAGNOSIS — Z8673 Personal history of transient ischemic attack (TIA), and cerebral infarction without residual deficits: Secondary | ICD-10-CM | POA: Diagnosis not present

## 2017-12-17 DIAGNOSIS — R739 Hyperglycemia, unspecified: Secondary | ICD-10-CM | POA: Diagnosis not present

## 2017-12-17 DIAGNOSIS — Z125 Encounter for screening for malignant neoplasm of prostate: Secondary | ICD-10-CM | POA: Diagnosis not present

## 2017-12-17 DIAGNOSIS — I739 Peripheral vascular disease, unspecified: Secondary | ICD-10-CM | POA: Diagnosis not present

## 2017-12-17 DIAGNOSIS — E7849 Other hyperlipidemia: Secondary | ICD-10-CM | POA: Diagnosis not present

## 2017-12-24 DIAGNOSIS — D500 Iron deficiency anemia secondary to blood loss (chronic): Secondary | ICD-10-CM | POA: Diagnosis not present

## 2017-12-24 DIAGNOSIS — I10 Essential (primary) hypertension: Secondary | ICD-10-CM | POA: Diagnosis not present

## 2017-12-24 DIAGNOSIS — K219 Gastro-esophageal reflux disease without esophagitis: Secondary | ICD-10-CM | POA: Diagnosis not present

## 2017-12-24 DIAGNOSIS — Z0000 Encounter for general adult medical examination without abnormal findings: Secondary | ICD-10-CM | POA: Diagnosis not present

## 2017-12-24 DIAGNOSIS — R739 Hyperglycemia, unspecified: Secondary | ICD-10-CM | POA: Diagnosis not present

## 2017-12-24 DIAGNOSIS — E7849 Other hyperlipidemia: Secondary | ICD-10-CM | POA: Diagnosis not present

## 2017-12-24 DIAGNOSIS — I739 Peripheral vascular disease, unspecified: Secondary | ICD-10-CM | POA: Diagnosis not present

## 2017-12-24 DIAGNOSIS — R600 Localized edema: Secondary | ICD-10-CM | POA: Diagnosis not present

## 2017-12-31 DIAGNOSIS — D500 Iron deficiency anemia secondary to blood loss (chronic): Secondary | ICD-10-CM | POA: Diagnosis not present

## 2018-01-11 DIAGNOSIS — T161XXA Foreign body in right ear, initial encounter: Secondary | ICD-10-CM | POA: Diagnosis not present

## 2018-01-14 DIAGNOSIS — D500 Iron deficiency anemia secondary to blood loss (chronic): Secondary | ICD-10-CM | POA: Diagnosis not present

## 2018-01-14 DIAGNOSIS — Z8673 Personal history of transient ischemic attack (TIA), and cerebral infarction without residual deficits: Secondary | ICD-10-CM | POA: Diagnosis not present

## 2018-01-21 DIAGNOSIS — R739 Hyperglycemia, unspecified: Secondary | ICD-10-CM | POA: Diagnosis not present

## 2018-01-21 DIAGNOSIS — K219 Gastro-esophageal reflux disease without esophagitis: Secondary | ICD-10-CM | POA: Diagnosis not present

## 2018-01-21 DIAGNOSIS — I739 Peripheral vascular disease, unspecified: Secondary | ICD-10-CM | POA: Diagnosis not present

## 2018-01-21 DIAGNOSIS — D509 Iron deficiency anemia, unspecified: Secondary | ICD-10-CM | POA: Diagnosis not present

## 2018-01-21 DIAGNOSIS — D649 Anemia, unspecified: Secondary | ICD-10-CM | POA: Diagnosis not present

## 2018-01-21 DIAGNOSIS — I10 Essential (primary) hypertension: Secondary | ICD-10-CM | POA: Diagnosis not present

## 2018-01-21 DIAGNOSIS — R600 Localized edema: Secondary | ICD-10-CM | POA: Diagnosis not present

## 2018-01-21 DIAGNOSIS — E7849 Other hyperlipidemia: Secondary | ICD-10-CM | POA: Diagnosis not present

## 2018-02-18 DIAGNOSIS — Z8673 Personal history of transient ischemic attack (TIA), and cerebral infarction without residual deficits: Secondary | ICD-10-CM | POA: Diagnosis not present

## 2018-02-18 DIAGNOSIS — D649 Anemia, unspecified: Secondary | ICD-10-CM | POA: Diagnosis not present

## 2018-02-19 DIAGNOSIS — D509 Iron deficiency anemia, unspecified: Secondary | ICD-10-CM | POA: Diagnosis not present

## 2018-02-25 DIAGNOSIS — R600 Localized edema: Secondary | ICD-10-CM | POA: Diagnosis not present

## 2018-02-25 DIAGNOSIS — E7849 Other hyperlipidemia: Secondary | ICD-10-CM | POA: Diagnosis not present

## 2018-02-25 DIAGNOSIS — D649 Anemia, unspecified: Secondary | ICD-10-CM | POA: Diagnosis not present

## 2018-02-25 DIAGNOSIS — I10 Essential (primary) hypertension: Secondary | ICD-10-CM | POA: Diagnosis not present

## 2018-02-25 DIAGNOSIS — K219 Gastro-esophageal reflux disease without esophagitis: Secondary | ICD-10-CM | POA: Diagnosis not present

## 2018-02-25 DIAGNOSIS — R739 Hyperglycemia, unspecified: Secondary | ICD-10-CM | POA: Diagnosis not present

## 2018-02-25 DIAGNOSIS — I739 Peripheral vascular disease, unspecified: Secondary | ICD-10-CM | POA: Diagnosis not present

## 2018-03-25 DIAGNOSIS — Z8673 Personal history of transient ischemic attack (TIA), and cerebral infarction without residual deficits: Secondary | ICD-10-CM | POA: Diagnosis not present

## 2018-04-24 ENCOUNTER — Encounter: Payer: Self-pay | Admitting: *Deleted

## 2018-04-25 ENCOUNTER — Encounter
Admission: RE | Disposition: A | Discharge status: Home / Self Care | Payer: Self-pay | Source: Home / Self Care | Attending: Gastroenterology

## 2018-04-25 ENCOUNTER — Ambulatory Visit: Payer: Medicare HMO | Admitting: Anesthesiology

## 2018-04-25 ENCOUNTER — Encounter: Payer: Self-pay | Admitting: Anesthesiology

## 2018-04-25 ENCOUNTER — Ambulatory Visit
Admission: RE | Admit: 2018-04-25 | Discharge: 2018-04-25 | Disposition: A | Discharge status: Home / Self Care | Payer: Medicare HMO | Attending: Gastroenterology | Admitting: Gastroenterology

## 2018-04-25 DIAGNOSIS — K219 Gastro-esophageal reflux disease without esophagitis: Secondary | ICD-10-CM | POA: Diagnosis not present

## 2018-04-25 DIAGNOSIS — K2950 Unspecified chronic gastritis without bleeding: Secondary | ICD-10-CM | POA: Insufficient documentation

## 2018-04-25 DIAGNOSIS — K317 Polyp of stomach and duodenum: Secondary | ICD-10-CM | POA: Diagnosis not present

## 2018-04-25 DIAGNOSIS — B9681 Helicobacter pylori [H. pylori] as the cause of diseases classified elsewhere: Secondary | ICD-10-CM | POA: Diagnosis not present

## 2018-04-25 DIAGNOSIS — K259 Gastric ulcer, unspecified as acute or chronic, without hemorrhage or perforation: Secondary | ICD-10-CM | POA: Diagnosis not present

## 2018-04-25 DIAGNOSIS — K635 Polyp of colon: Secondary | ICD-10-CM | POA: Diagnosis not present

## 2018-04-25 DIAGNOSIS — K228 Other specified diseases of esophagus: Secondary | ICD-10-CM | POA: Diagnosis not present

## 2018-04-25 DIAGNOSIS — K3189 Other diseases of stomach and duodenum: Secondary | ICD-10-CM | POA: Diagnosis not present

## 2018-04-25 DIAGNOSIS — D509 Iron deficiency anemia, unspecified: Secondary | ICD-10-CM | POA: Diagnosis not present

## 2018-04-25 DIAGNOSIS — K5790 Diverticulosis of intestine, part unspecified, without perforation or abscess without bleeding: Secondary | ICD-10-CM | POA: Diagnosis not present

## 2018-04-25 DIAGNOSIS — K210 Gastro-esophageal reflux disease with esophagitis: Secondary | ICD-10-CM | POA: Diagnosis not present

## 2018-04-25 DIAGNOSIS — K5730 Diverticulosis of large intestine without perforation or abscess without bleeding: Secondary | ICD-10-CM | POA: Diagnosis not present

## 2018-04-25 DIAGNOSIS — I10 Essential (primary) hypertension: Secondary | ICD-10-CM | POA: Insufficient documentation

## 2018-04-25 DIAGNOSIS — K2210 Ulcer of esophagus without bleeding: Secondary | ICD-10-CM | POA: Diagnosis not present

## 2018-04-25 DIAGNOSIS — I739 Peripheral vascular disease, unspecified: Secondary | ICD-10-CM | POA: Diagnosis not present

## 2018-04-25 DIAGNOSIS — K621 Rectal polyp: Secondary | ICD-10-CM | POA: Insufficient documentation

## 2018-04-25 DIAGNOSIS — Z87891 Personal history of nicotine dependence: Secondary | ICD-10-CM | POA: Diagnosis not present

## 2018-04-25 DIAGNOSIS — D124 Benign neoplasm of descending colon: Secondary | ICD-10-CM | POA: Insufficient documentation

## 2018-04-25 DIAGNOSIS — Z7901 Long term (current) use of anticoagulants: Secondary | ICD-10-CM | POA: Diagnosis not present

## 2018-04-25 DIAGNOSIS — Z79899 Other long term (current) drug therapy: Secondary | ICD-10-CM | POA: Diagnosis not present

## 2018-04-25 DIAGNOSIS — E785 Hyperlipidemia, unspecified: Secondary | ICD-10-CM | POA: Diagnosis not present

## 2018-04-25 DIAGNOSIS — K2960 Other gastritis without bleeding: Secondary | ICD-10-CM | POA: Diagnosis not present

## 2018-04-25 DIAGNOSIS — Z7982 Long term (current) use of aspirin: Secondary | ICD-10-CM | POA: Insufficient documentation

## 2018-04-25 DIAGNOSIS — Z8673 Personal history of transient ischemic attack (TIA), and cerebral infarction without residual deficits: Secondary | ICD-10-CM | POA: Diagnosis not present

## 2018-04-25 DIAGNOSIS — K2900 Acute gastritis without bleeding: Secondary | ICD-10-CM | POA: Diagnosis not present

## 2018-04-25 DIAGNOSIS — Z86010 Personal history of colonic polyps: Secondary | ICD-10-CM | POA: Diagnosis not present

## 2018-04-25 DIAGNOSIS — N529 Male erectile dysfunction, unspecified: Secondary | ICD-10-CM | POA: Diagnosis not present

## 2018-04-25 HISTORY — DX: Localized edema: R60.0

## 2018-04-25 HISTORY — DX: Hyperlipidemia, unspecified: E78.5

## 2018-04-25 HISTORY — PX: ESOPHAGOGASTRODUODENOSCOPY (EGD) WITH PROPOFOL: SHX5813

## 2018-04-25 HISTORY — PX: COLONOSCOPY WITH PROPOFOL: SHX5780

## 2018-04-25 LAB — PROTIME-INR
INR: 1.05
Prothrombin Time: 13.6 seconds (ref 11.4–15.2)

## 2018-04-25 SURGERY — ESOPHAGOGASTRODUODENOSCOPY (EGD) WITH PROPOFOL
Anesthesia: General

## 2018-04-25 MED ORDER — SODIUM CHLORIDE 0.9 % IV SOLN: INTRAVENOUS | Status: DC

## 2018-04-25 MED ORDER — EPHEDRINE SULFATE 50 MG/ML IJ SOLN
INTRAMUSCULAR | Status: AC
  Filled 2018-04-25: qty 1

## 2018-04-25 MED ORDER — MIDAZOLAM HCL 2 MG/2ML IJ SOLN
INTRAMUSCULAR | Status: DC | PRN
  Administered 2018-04-25: 2 mg via INTRAVENOUS

## 2018-04-25 MED ORDER — SODIUM CHLORIDE 0.9 % IV SOLN
INTRAVENOUS | Status: DC
  Administered 2018-04-25: 1000 mL via INTRAVENOUS

## 2018-04-25 MED ORDER — EPHEDRINE SULFATE 50 MG/ML IJ SOLN
INTRAMUSCULAR | Status: DC | PRN
  Administered 2018-04-25 (×3): 10 mg via INTRAVENOUS

## 2018-04-25 MED ORDER — LIDOCAINE HCL (CARDIAC) PF 100 MG/5ML IV SOSY
PREFILLED_SYRINGE | INTRAVENOUS | Status: DC | PRN
  Administered 2018-04-25: 30 mg via INTRAVENOUS

## 2018-04-25 MED ORDER — MIDAZOLAM HCL 2 MG/2ML IJ SOLN
INTRAMUSCULAR | Status: AC
  Filled 2018-04-25: qty 2

## 2018-04-25 MED ORDER — LIDOCAINE HCL (PF) 2 % IJ SOLN
INTRAMUSCULAR | Status: AC
  Filled 2018-04-25: qty 10

## 2018-04-25 MED ORDER — PROPOFOL 500 MG/50ML IV EMUL
INTRAVENOUS | Status: AC
  Filled 2018-04-25: qty 50

## 2018-04-25 MED ORDER — PROPOFOL 500 MG/50ML IV EMUL
INTRAVENOUS | Status: DC | PRN
  Administered 2018-04-25: 120 ug/kg/min via INTRAVENOUS

## 2018-04-25 MED ORDER — FENTANYL CITRATE (PF) 100 MCG/2ML IJ SOLN
INTRAMUSCULAR | Status: AC
  Filled 2018-04-25: qty 2

## 2018-04-25 MED ORDER — PROPOFOL 10 MG/ML IV BOLUS
INTRAVENOUS | Status: AC
  Filled 2018-04-25: qty 20

## 2018-04-25 MED ORDER — FENTANYL CITRATE (PF) 100 MCG/2ML IJ SOLN
INTRAMUSCULAR | Status: DC | PRN
  Administered 2018-04-25: 50 ug via INTRAVENOUS

## 2018-04-25 NOTE — Op Note (Signed)
[facility] Patient Name: Kenneth Hawkins Procedure Date: 04/25/2018 9:12 AM MRN: [id number] Account #: [id number] Date of Birth: [birth year] Admit Type: Outpatient Age: 73 Room: [facility] ENDO ROOM 3 Gender: Male Note Status: Finalized Procedure:            Upper GI endoscopy Indications:          Iron deficiency anemia Providers:            Lollie Sails, MD Referring MD:         Ramonita Lab, MD (Referring MD) Medicines:            Monitored Anesthesia Care Complications:        No immediate complications. Procedure:            Pre-Anesthesia Assessment:                       - ASA Grade Assessment: III - A patient with severe                        systemic disease.                       After obtaining informed consent, the endoscope was                        passed under direct vision. Throughout the procedure,                        the patient's blood pressure, pulse, and oxygen                        saturations were monitored continuously. The Endoscope                        was introduced through the mouth, and advanced to the                        third part of duodenum. The upper GI endoscopy was                        accomplished without difficulty. The patient tolerated                        the procedure well. Findings:      LA Grade A (one or more mucosal breaks less than 5 mm, not extending       between tops of 2 mucosal folds) esophagitis with no bleeding was found.       Biopsies were taken with a cold forceps for histology.      A single 4 mm mucosal nodule with a localized distribution was found in       the distal esophagus, 37 cm from the incisors. This was biopsied/removed       with a cold forceps for histology.      Scattered minimal inflammation characterized by erosions was found in       the gastric antrum. Biopsies were taken with a cold forceps for       histology.      A single 3 mm sessile polyp with no bleeding  and no stigmata of recent       bleeding was found on the  greater curvature of the gastric body. The       polyp was removed with a cold biopsy forceps. Resection and retrieval       were complete.      The examined duodenum was normal. Biopsies were taken with a cold       forceps for histology. Impression:           - LA Grade A erosive esophagitis. Biopsied.                       - Mucosal nodule found in the esophagus. Biopsied.                       - Erosive gastritis. Biopsied.                       - A single gastric polyp. Resected and retrieved.                       - Normal examined duodenum. Biopsied. Recommendation:       - Continue present medications.                       - Await pathology results.                       - Return to GI clinic in 4 weeks. Procedure Code(s):    --- Professional ---                       [phone], Esophagogastroduodenoscopy, flexible, transoral;                        with biopsy, single or multiple Diagnosis Code(s):    --- Professional ---                       K20.8, Other esophagitis                       K22.8, Other specified diseases of esophagus                       K29.60, Other gastritis without bleeding                       K31.7, Polyp of stomach and duodenum                       D50.9, Iron deficiency anemia, unspecified CPT copyright 2018 American Medical Association. All rights reserved. The codes documented in this report are preliminary and upon coder review may  be revised to meet current compliance requirements. Lollie Sails, MD 04/25/2018 9:58:54 AM This report has been signed electronically. Number of Addenda: 0 Note Initiated On: 04/25/2018 9:12 AM      [facility]

## 2018-04-25 NOTE — Anesthesia Procedure Notes (Signed)
Performed by: Cook-Martin, Dechelle Attaway Pre-anesthesia Checklist: Patient identified, Emergency Drugs available, Suction available, Patient being monitored and Timeout performed Patient Re-evaluated:Patient Re-evaluated prior to induction Oxygen Delivery Method: Nasal cannula Preoxygenation: Pre-oxygenation with 100% oxygen Induction Type: IV induction Airway Equipment and Method: Bite block Placement Confirmation: positive ETCO2 and CO2 detector       

## 2018-04-25 NOTE — Anesthesia Post-op Follow-up Note (Signed)
Anesthesia QCDR form completed.        

## 2018-04-25 NOTE — Transfer of Care (Signed)
Immediate Anesthesia Transfer of Care Note  Patient: Kenneth Hawkins  Procedure(s) Performed: ESOPHAGOGASTRODUODENOSCOPY (EGD) WITH PROPOFOL (N/A ) COLONOSCOPY WITH PROPOFOL  Patient Location: PACU  Anesthesia Type:General  Level of Consciousness: awake and sedated  Airway & Oxygen Therapy: Patient Spontanous Breathing and Patient connected to nasal cannula oxygen  Post-op Assessment: Report given to RN and Post -op Vital signs reviewed and stable  Post vital signs: Reviewed and stable  Last Vitals:  Vitals Value Taken Time  BP    Temp    Pulse    Resp    SpO2      Last Pain:  Vitals:   04/25/18 0746  TempSrc: Tympanic  PainSc: 0-No pain         Complications: No apparent anesthesia complications

## 2018-04-25 NOTE — H&P (Addendum)
Outpatient short stay form Pre-procedure 04/25/2018 9:20 AM Kenneth Sails MD  Primary Physician: Ramonita Lab, MD  Reason for visit: EGD and colonoscopy  History of present illness: Patient is a 73 year old male presenting today for further evaluation regards to history of iron deficiency anemia.  Does take Coumadin is held that for 5 to 6 days.  INR today was 1.05 patient takes no other blood thinning agent or aspirin product.  Patient has been on oral iron.  Denies any GI symptoms including nausea vomiting abdominal pain reflux dysphagia diarrhea or blood in the stool.    Current Facility-Administered Medications:  .  0.9 %  sodium chloride infusion, , Intravenous, Continuous, Kenneth Sails, MD, Last Rate: 20 mL/hr at 04/25/18 0815 .  0.9 %  sodium chloride infusion, , Intravenous, Continuous, Kenneth Sails, MD  Medications Prior to Admission  Medication Sig Dispense Refill Last Dose  . aspirin EC 81 MG tablet Take by mouth.   Past Week at Unknown time  . ferrous sulfate 325 (65 FE) MG EC tablet Take 325 mg by mouth 3 (three) times daily with meals.   Past Week at Unknown time  . finasteride (PROSCAR) 5 MG tablet Take 1 tablet (5 mg total) by mouth daily. 90 tablet 3 Past Week at Unknown time  . omeprazole (PRILOSEC) 20 MG capsule TAKE 1 CAPSULE EVERY DAY   04/24/2018 at Unknown time  . ondansetron (ZOFRAN ODT) 4 MG disintegrating tablet Take 1 tablet (4 mg total) by mouth every 8 (eight) hours as needed for nausea or vomiting. 20 tablet 0 Past Week at Unknown time  . ranitidine (ZANTAC) 150 MG capsule Take 1 capsule (150 mg total) by mouth 2 (two) times daily. 28 capsule 0 Past Week at Unknown time  . sildenafil (REVATIO) 20 MG tablet Take 1 to 5 tabs PO daily prn 30 tablet 3 Past Month at Unknown time  . sildenafil (VIAGRA) 100 MG tablet Take by mouth.   Past Month at Unknown time  . sildenafil (VIAGRA) 100 MG tablet Take 1 tablet (100 mg total) by mouth daily as needed for  erectile dysfunction. 6 tablet 0 Past Month at Unknown time  . simvastatin (ZOCOR) 80 MG tablet TAKE 1 TABLET ONE TIME DAILY   04/24/2018 at Unknown time  . tadalafil (ADCIRCA/CIALIS) 20 MG tablet Take 1 tablet (20 mg total) by mouth daily as needed for erectile dysfunction. 10 tablet 11 Past Month at Unknown time  . warfarin (COUMADIN) 3 MG tablet TAKE 1 TABLET EVERY DAY   Past Week at Unknown time  . amLODipine (NORVASC) 5 MG tablet Take by mouth.   Taking  . atorvastatin (LIPITOR) 80 MG tablet Take by mouth.   Taking  . furosemide (LASIX) 40 MG tablet Take by mouth.   Taking  . losartan-hydrochlorothiazide (HYZAAR) 100-25 MG per tablet Take by mouth.   02/01/2015 at 0730  . metoprolol tartrate (LOPRESSOR) 25 MG tablet Take by mouth.   Taking  . potassium chloride (K-DUR) 10 MEQ tablet Take by mouth.   Taking     Allergies  Allergen Reactions  . Ace Inhibitors Cough     Past Medical History:  Diagnosis Date  . Anemia   . BPH (benign prostatic hyperplasia)   . BPH with obstruction/lower urinary tract symptoms   . CVA (cerebral vascular accident) ([place])   . ED (erectile dysfunction)   . GERD (gastroesophageal reflux disease)   . History of TIAs   . Hyperglycemia   .  Hyperlipemia   . Hypertension   . Leg edema   . Peripheral vascular disease ([place])   . Prostatitis     Review of systems:      Physical Exam    Heart and lungs: Regular rate and rhythm without rub or gallop, lungs are bilaterally clear.    HEENT: Normocephalic atraumatic eyes are anicteric    Other:    Pertinant exam for procedure: Soft nontender nondistended bowel sounds positive normoactive    Planned proceedures: EGD, colonoscopy and indicated procedures. I have discussed the risks benefits and complications of procedures to include not limited to bleeding, infection, perforation and the risk of sedation and the patient wishes to proceed.    Kenneth Sails, MD Gastroenterology 04/25/2018  9:20  AM

## 2018-04-25 NOTE — Op Note (Signed)
[facility]enter[facility] Patient Name: Kenneth Hawkins Procedure Date: 04/25/2018 9:14 AM MRN: [id number] Account #: [id number] Date of Birth: [birth year] Admit Type: Outpatient Age: 73 Room: [facility] ENDO ROOM 3 Gender: Male Note Status: Finalized Procedure:            Colonoscopy Indications:          Iron deficiency anemia Providers:            Lollie Sails, MD Referring MD:         Ramonita Lab, MD (Referring MD) Medicines:            Monitored Anesthesia Care Complications:        No immediate complications. Procedure:            Pre-Anesthesia Assessment:                       - ASA Grade Assessment: III - A patient with severe                        systemic disease.                       After obtaining informed consent, the colonoscope was                        passed under direct vision. Throughout the procedure,                        the patient's blood pressure, pulse, and oxygen                        saturations were monitored continuously. The                        Colonoscope was introduced through the anus and                        advanced to the the cecum, identified by appendiceal                        orifice and ileocecal valve. The colonoscopy was                        unusually difficult due to poor bowel prep. Successful                        completion of the procedure was aided by lavage. The                        quality of the bowel preparation was good except the                        ascending colon was fair. Findings:      A 8 mm polyp was found in the proximal descending colon. The polyp was       sessile. The polyp was removed with a cold snare. Resection and       retrieval were complete.      Five sessile polyps were found in the rectum. The polyps were 1 to 2 mm       in size. These polyps were removed  with a cold biopsy forceps. Resection       and retrieval were complete.      A few small-mouthed diverticula were  found in the sigmoid colon.      The retroflexed view of the distal rectum and anal verge was normal and       showed no anal or rectal abnormalities. Impression:           - One 8 mm polyp in the proximal descending colon,                        removed with a cold snare. Resected and retrieved.                       - Five 1 to 2 mm polyps in the rectum, removed with a                        cold biopsy forceps. Resected and retrieved.                       - Diverticulosis in the sigmoid colon.                       - The distal rectum and anal verge are normal on                        retroflexion view. Recommendation:       - Soft diet today, then advance as tolerated to advance                        diet as tolerated.                       - Await pathology results.                       - Telephone GI clinic for pathology results in 1 week. Procedure Code(s):    --- Professional ---                       [phone], Colonoscopy, flexible; with removal of tumor(s),                        polyp(s), or other lesion(s) by snare technique                       45380, 25, Colonoscopy, flexible; with biopsy, single                        or multiple CPT copyright 2018 American Medical Association. All rights reserved. The codes documented in this report are preliminary and upon coder review may  be revised to meet current compliance requirements. Lollie Sails, MD 04/25/2018 10:28:15 AM This report has been signed electronically. Number of Addenda: 0 Note Initiated On: 04/25/2018 9:14 AM Scope Withdrawal Time: 0 hours 10 minutes 46 seconds  Total Procedure Duration: 0 hours 21 minutes 48 seconds       [facility]

## 2018-04-25 NOTE — Anesthesia Postprocedure Evaluation (Signed)
Anesthesia Post Note  Patient: Kenneth Hawkins  Procedure(s) Performed: ESOPHAGOGASTRODUODENOSCOPY (EGD) WITH PROPOFOL (N/A ) COLONOSCOPY WITH PROPOFOL  Patient location during evaluation: PACU Anesthesia Type: General Level of consciousness: awake and alert Pain management: pain level controlled Vital Signs Assessment: post-procedure vital signs reviewed and stable Respiratory status: spontaneous breathing, nonlabored ventilation, respiratory function stable and patient connected to nasal cannula oxygen Cardiovascular status: blood pressure returned to baseline and stable Postop Assessment: no apparent nausea or vomiting Anesthetic complications: no     Last Vitals:  Vitals:   04/25/18 1038 04/25/18 1048  BP: 135/68 124/64  Pulse: (!) 52 (!) 55  Resp: (!) 9 10  Temp:    SpO2: 100% 100%    Last Pain:  Vitals:   04/25/18 1048  TempSrc:   PainSc: 0-No pain                 Molli Barrows

## 2018-04-25 NOTE — Anesthesia Preprocedure Evaluation (Signed)
Anesthesia Evaluation  Patient identified by MRN, date of birth, ID band Patient awake    Reviewed: Allergy & Precautions, H&P , NPO status , Patient's Chart, lab work & pertinent test results, reviewed documented beta blocker date and time   Airway Mallampati: II   Neck ROM: full    Dental  (+) Poor Dentition   Pulmonary neg pulmonary ROS, former smoker,    Pulmonary exam normal        Cardiovascular Exercise Tolerance: Good hypertension, On Medications + Peripheral Vascular Disease  negative cardio ROS Normal cardiovascular exam Rhythm:regular Rate:Normal     Neuro/Psych CVA negative neurological ROS  negative psych ROS   GI/Hepatic negative GI ROS, Neg liver ROS, GERD  Medicated,  Endo/Other  negative endocrine ROS  Renal/GU negative Renal ROS  negative genitourinary   Musculoskeletal   Abdominal   Peds  Hematology negative hematology ROS (+) Blood dyscrasia, anemia ,   Anesthesia Other Findings Past Medical History: No date: Anemia No date: BPH (benign prostatic hyperplasia) No date: BPH with obstruction/lower urinary tract symptoms No date: CVA (cerebral vascular accident) ([place]) No date: ED (erectile dysfunction) No date: GERD (gastroesophageal reflux disease) No date: History of TIAs No date: Hyperglycemia No date: Hyperlipemia No date: Hypertension No date: Leg edema No date: Peripheral vascular disease (HCC) No date: Prostatitis Past Surgical History: No date: COLONOSCOPY WITH ESOPHAGOGASTRODUODENOSCOPY (EGD) 02/01/2015: COLONOSCOPY WITH PROPOFOL; N/A     Comment:  Procedure: COLONOSCOPY WITH PROPOFOL;  Surgeon: Lollie Sails, MD;  Location: [facility] ENDOSCOPY;  Service:               Endoscopy;  Laterality: N/A; BMI    Body Mass Index:  25.77 kg/m     Reproductive/Obstetrics negative OB ROS                             Anesthesia Physical Anesthesia  Plan  ASA: III  Anesthesia Plan: General   Post-op Pain Management:    Induction:   PONV Risk Score and Plan:   Airway Management Planned:   Additional Equipment:   Intra-op Plan:   Post-operative Plan:   Informed Consent: I have reviewed the patients History and Physical, chart, labs and discussed the procedure including the risks, benefits and alternatives for the proposed anesthesia with the patient or authorized representative who has indicated his/her understanding and acceptance.     Dental Advisory Given  Plan Discussed with: CRNA  Anesthesia Plan Comments:         Anesthesia Quick Evaluation

## 2018-04-26 ENCOUNTER — Encounter: Payer: Self-pay | Admitting: Gastroenterology

## 2018-04-29 ENCOUNTER — Other Ambulatory Visit: Payer: Self-pay | Admitting: Gastroenterology

## 2018-04-29 DIAGNOSIS — Z8673 Personal history of transient ischemic attack (TIA), and cerebral infarction without residual deficits: Secondary | ICD-10-CM | POA: Diagnosis not present

## 2018-04-29 DIAGNOSIS — D509 Iron deficiency anemia, unspecified: Secondary | ICD-10-CM

## 2018-04-30 LAB — SURGICAL PATHOLOGY

## 2018-05-06 DIAGNOSIS — Z8673 Personal history of transient ischemic attack (TIA), and cerebral infarction without residual deficits: Secondary | ICD-10-CM | POA: Diagnosis not present

## 2018-05-15 ENCOUNTER — Ambulatory Visit
Admission: RE | Admit: 2018-05-15 | Discharge: 2018-05-15 | Disposition: A | Discharge status: Home / Self Care | Payer: Medicare HMO | Source: Ambulatory Visit | Attending: Gastroenterology | Admitting: Gastroenterology

## 2018-05-15 DIAGNOSIS — K219 Gastro-esophageal reflux disease without esophagitis: Secondary | ICD-10-CM | POA: Diagnosis not present

## 2018-05-15 DIAGNOSIS — D509 Iron deficiency anemia, unspecified: Secondary | ICD-10-CM | POA: Insufficient documentation

## 2018-05-28 DIAGNOSIS — D369 Benign neoplasm, unspecified site: Secondary | ICD-10-CM | POA: Diagnosis not present

## 2018-05-28 DIAGNOSIS — K2970 Gastritis, unspecified, without bleeding: Secondary | ICD-10-CM | POA: Diagnosis not present

## 2018-05-28 DIAGNOSIS — K210 Gastro-esophageal reflux disease with esophagitis: Secondary | ICD-10-CM | POA: Diagnosis not present

## 2018-05-28 DIAGNOSIS — D500 Iron deficiency anemia secondary to blood loss (chronic): Secondary | ICD-10-CM | POA: Diagnosis not present

## 2018-05-28 DIAGNOSIS — B9681 Helicobacter pylori [H. pylori] as the cause of diseases classified elsewhere: Secondary | ICD-10-CM | POA: Diagnosis not present

## 2018-06-03 DIAGNOSIS — D500 Iron deficiency anemia secondary to blood loss (chronic): Secondary | ICD-10-CM | POA: Diagnosis not present

## 2018-06-04 DIAGNOSIS — Z8673 Personal history of transient ischemic attack (TIA), and cerebral infarction without residual deficits: Secondary | ICD-10-CM | POA: Diagnosis not present

## 2018-06-04 DIAGNOSIS — D500 Iron deficiency anemia secondary to blood loss (chronic): Secondary | ICD-10-CM | POA: Diagnosis not present

## 2018-06-05 DIAGNOSIS — Z0100 Encounter for examination of eyes and vision without abnormal findings: Secondary | ICD-10-CM | POA: Diagnosis not present

## 2018-06-05 DIAGNOSIS — H2513 Age-related nuclear cataract, bilateral: Secondary | ICD-10-CM | POA: Diagnosis not present

## 2018-06-06 DIAGNOSIS — D500 Iron deficiency anemia secondary to blood loss (chronic): Secondary | ICD-10-CM | POA: Diagnosis not present

## 2018-06-06 DIAGNOSIS — D649 Anemia, unspecified: Secondary | ICD-10-CM | POA: Diagnosis not present

## 2018-06-06 DIAGNOSIS — K210 Gastro-esophageal reflux disease with esophagitis: Secondary | ICD-10-CM | POA: Diagnosis not present

## 2018-06-06 DIAGNOSIS — K5900 Constipation, unspecified: Secondary | ICD-10-CM | POA: Diagnosis not present

## 2018-06-06 DIAGNOSIS — D369 Benign neoplasm, unspecified site: Secondary | ICD-10-CM | POA: Diagnosis not present

## 2018-07-09 DIAGNOSIS — Z8673 Personal history of transient ischemic attack (TIA), and cerebral infarction without residual deficits: Secondary | ICD-10-CM | POA: Diagnosis not present

## 2018-08-08 DIAGNOSIS — Z8673 Personal history of transient ischemic attack (TIA), and cerebral infarction without residual deficits: Secondary | ICD-10-CM | POA: Diagnosis not present

## 2018-08-21 DIAGNOSIS — I10 Essential (primary) hypertension: Secondary | ICD-10-CM | POA: Diagnosis not present

## 2018-08-21 DIAGNOSIS — R739 Hyperglycemia, unspecified: Secondary | ICD-10-CM | POA: Diagnosis not present

## 2018-08-21 DIAGNOSIS — E7849 Other hyperlipidemia: Secondary | ICD-10-CM | POA: Diagnosis not present

## 2018-08-21 DIAGNOSIS — D500 Iron deficiency anemia secondary to blood loss (chronic): Secondary | ICD-10-CM | POA: Diagnosis not present

## 2018-08-21 DIAGNOSIS — I739 Peripheral vascular disease, unspecified: Secondary | ICD-10-CM | POA: Diagnosis not present

## 2018-08-28 DIAGNOSIS — Z125 Encounter for screening for malignant neoplasm of prostate: Secondary | ICD-10-CM | POA: Diagnosis not present

## 2018-08-28 DIAGNOSIS — I739 Peripheral vascular disease, unspecified: Secondary | ICD-10-CM | POA: Diagnosis not present

## 2018-08-28 DIAGNOSIS — D649 Anemia, unspecified: Secondary | ICD-10-CM | POA: Diagnosis not present

## 2018-08-28 DIAGNOSIS — R600 Localized edema: Secondary | ICD-10-CM | POA: Diagnosis not present

## 2018-08-28 DIAGNOSIS — I10 Essential (primary) hypertension: Secondary | ICD-10-CM | POA: Diagnosis not present

## 2018-08-28 DIAGNOSIS — K219 Gastro-esophageal reflux disease without esophagitis: Secondary | ICD-10-CM | POA: Diagnosis not present

## 2018-08-28 DIAGNOSIS — R739 Hyperglycemia, unspecified: Secondary | ICD-10-CM | POA: Diagnosis not present

## 2018-08-28 DIAGNOSIS — E7849 Other hyperlipidemia: Secondary | ICD-10-CM | POA: Diagnosis not present

## 2018-09-18 DIAGNOSIS — Z8673 Personal history of transient ischemic attack (TIA), and cerebral infarction without residual deficits: Secondary | ICD-10-CM | POA: Diagnosis not present

## 2018-09-18 DIAGNOSIS — I739 Peripheral vascular disease, unspecified: Secondary | ICD-10-CM | POA: Diagnosis not present

## 2018-09-18 DIAGNOSIS — D649 Anemia, unspecified: Secondary | ICD-10-CM | POA: Diagnosis not present

## 2018-10-21 DIAGNOSIS — Z8673 Personal history of transient ischemic attack (TIA), and cerebral infarction without residual deficits: Secondary | ICD-10-CM | POA: Diagnosis not present

## 2018-11-05 DIAGNOSIS — Z8673 Personal history of transient ischemic attack (TIA), and cerebral infarction without residual deficits: Secondary | ICD-10-CM | POA: Diagnosis not present

## 2018-12-04 DIAGNOSIS — Z8673 Personal history of transient ischemic attack (TIA), and cerebral infarction without residual deficits: Secondary | ICD-10-CM | POA: Diagnosis not present

## 2018-12-09 ENCOUNTER — Ambulatory Visit: Payer: Medicare HMO | Admitting: Urology

## 2018-12-09 ENCOUNTER — Encounter: Payer: Self-pay | Admitting: Urology

## 2018-12-24 DIAGNOSIS — I739 Peripheral vascular disease, unspecified: Secondary | ICD-10-CM | POA: Diagnosis not present

## 2018-12-24 DIAGNOSIS — Z125 Encounter for screening for malignant neoplasm of prostate: Secondary | ICD-10-CM | POA: Diagnosis not present

## 2018-12-24 DIAGNOSIS — R739 Hyperglycemia, unspecified: Secondary | ICD-10-CM | POA: Diagnosis not present

## 2018-12-24 DIAGNOSIS — D649 Anemia, unspecified: Secondary | ICD-10-CM | POA: Diagnosis not present

## 2018-12-24 DIAGNOSIS — E7849 Other hyperlipidemia: Secondary | ICD-10-CM | POA: Diagnosis not present

## 2018-12-31 DIAGNOSIS — I739 Peripheral vascular disease, unspecified: Secondary | ICD-10-CM | POA: Diagnosis not present

## 2018-12-31 DIAGNOSIS — Z87891 Personal history of nicotine dependence: Secondary | ICD-10-CM | POA: Diagnosis not present

## 2018-12-31 DIAGNOSIS — Z0000 Encounter for general adult medical examination without abnormal findings: Secondary | ICD-10-CM | POA: Diagnosis not present

## 2018-12-31 DIAGNOSIS — D649 Anemia, unspecified: Secondary | ICD-10-CM | POA: Diagnosis not present

## 2018-12-31 DIAGNOSIS — I10 Essential (primary) hypertension: Secondary | ICD-10-CM | POA: Diagnosis not present

## 2018-12-31 DIAGNOSIS — R739 Hyperglycemia, unspecified: Secondary | ICD-10-CM | POA: Diagnosis not present

## 2018-12-31 DIAGNOSIS — K219 Gastro-esophageal reflux disease without esophagitis: Secondary | ICD-10-CM | POA: Diagnosis not present

## 2018-12-31 DIAGNOSIS — E7849 Other hyperlipidemia: Secondary | ICD-10-CM | POA: Diagnosis not present

## 2018-12-31 DIAGNOSIS — G459 Transient cerebral ischemic attack, unspecified: Secondary | ICD-10-CM | POA: Diagnosis not present

## 2019-01-01 ENCOUNTER — Other Ambulatory Visit: Payer: Self-pay

## 2019-01-01 ENCOUNTER — Ambulatory Visit (INDEPENDENT_AMBULATORY_CARE_PROVIDER_SITE_OTHER): Payer: Medicare HMO | Admitting: Urology

## 2019-01-01 VITALS — BP 176/82 | HR 72 | Ht 72.0 in | Wt 199.0 lb

## 2019-01-01 DIAGNOSIS — N529 Male erectile dysfunction, unspecified: Secondary | ICD-10-CM | POA: Diagnosis not present

## 2019-01-01 DIAGNOSIS — N400 Enlarged prostate without lower urinary tract symptoms: Secondary | ICD-10-CM | POA: Diagnosis not present

## 2019-01-01 LAB — BLADDER SCAN AMB NON-IMAGING: Scan Result: 67

## 2019-01-01 MED ORDER — FINASTERIDE 5 MG PO TABS: 5.0000 mg | ORAL_TABLET | Freq: Every day | ORAL | 3 refills | Status: DC

## 2019-01-01 NOTE — Progress Notes (Signed)
   01/01/2019 3:25 PM   Kenneth Hawkins 10-Mar-1946 [id number]  Reason for visit: Follow up BPH and ED  HPI: I saw [name] back in urology clinic for follow-up of BPH and ED.  He was previously followed by Dr. Pilar Hawkins.  We discontinued PSA screening at age 73, and his PSAs have been less than 1 corrected for finasteride.  Regarding his BPH, is very well controlled on finasteride, and he denies any significant urinary complaints today.  His IPSS score is 4, with quality of life pleased, and PVR 67 mL.  He has not been using sildenafil or tadalafil for ED over the last year.   ROS: Please see flowsheet from today's date for complete review of systems.  Physical Exam: BP (!) 176/82   Pulse 72   Ht 6' (1.829 m)   Wt 199 lb (90.3 kg)   BMI 26.99 kg/m    Constitutional:  Alert and oriented, No acute distress. Respiratory: Normal respiratory effort, no increased work of breathing. GI: Abdomen is soft, nontender, nondistended, no abdominal masses Skin: No rashes, bruises or suspicious lesions. Neurologic: Grossly intact, no focal deficits, moving all 4 extremities. Psychiatric: Normal mood and affect  Assessment & Plan:   In summary, he is a 73 year old male with BPH very well controlled on daily finasteride, as well as ED not currently taking medications.  We discussed return precautions including gross hematuria, urinary retention, or recurrent UTIs.  RTC 1 year for PVR Consider PCP follow-up if doing well at that time  A total of 15 minutes were spent face-to-face with the patient, greater than 50% was spent in patient education, counseling, and coordination of care regarding BPH and ED.  Kenneth Hawkins, [facility] [address], [place], [place] [address] [phone]

## 2019-01-27 DIAGNOSIS — Z8673 Personal history of transient ischemic attack (TIA), and cerebral infarction without residual deficits: Secondary | ICD-10-CM | POA: Diagnosis not present

## 2019-02-26 DIAGNOSIS — Z8673 Personal history of transient ischemic attack (TIA), and cerebral infarction without residual deficits: Secondary | ICD-10-CM | POA: Diagnosis not present

## 2019-03-06 DIAGNOSIS — D485 Neoplasm of uncertain behavior of skin: Secondary | ICD-10-CM | POA: Diagnosis not present

## 2019-03-06 DIAGNOSIS — D2361 Other benign neoplasm of skin of right upper limb, including shoulder: Secondary | ICD-10-CM | POA: Diagnosis not present

## 2019-03-06 DIAGNOSIS — B351 Tinea unguium: Secondary | ICD-10-CM | POA: Diagnosis not present

## 2019-03-06 DIAGNOSIS — L814 Other melanin hyperpigmentation: Secondary | ICD-10-CM | POA: Diagnosis not present

## 2019-03-06 DIAGNOSIS — L570 Actinic keratosis: Secondary | ICD-10-CM | POA: Diagnosis not present

## 2019-03-31 DIAGNOSIS — Z8673 Personal history of transient ischemic attack (TIA), and cerebral infarction without residual deficits: Secondary | ICD-10-CM | POA: Diagnosis not present

## 2019-04-17 DIAGNOSIS — L578 Other skin changes due to chronic exposure to nonionizing radiation: Secondary | ICD-10-CM | POA: Diagnosis not present

## 2019-04-17 DIAGNOSIS — L570 Actinic keratosis: Secondary | ICD-10-CM | POA: Diagnosis not present

## 2019-04-17 DIAGNOSIS — Z872 Personal history of diseases of the skin and subcutaneous tissue: Secondary | ICD-10-CM | POA: Diagnosis not present

## 2019-04-17 DIAGNOSIS — B377 Candidal sepsis: Secondary | ICD-10-CM | POA: Diagnosis not present

## 2019-04-17 DIAGNOSIS — B351 Tinea unguium: Secondary | ICD-10-CM | POA: Diagnosis not present

## 2019-04-28 DIAGNOSIS — Z8673 Personal history of transient ischemic attack (TIA), and cerebral infarction without residual deficits: Secondary | ICD-10-CM | POA: Diagnosis not present

## 2019-05-23 ENCOUNTER — Other Ambulatory Visit: Payer: Self-pay

## 2019-05-23 ENCOUNTER — Emergency Department: Payer: Medicare HMO

## 2019-05-23 ENCOUNTER — Emergency Department
Admission: EM | Admit: 2019-05-23 | Discharge: 2019-05-23 | Disposition: A | Discharge status: Other Acute Inpatient Hospital | Payer: Medicare HMO | Attending: Emergency Medicine | Admitting: Emergency Medicine

## 2019-05-23 DIAGNOSIS — Z79899 Other long term (current) drug therapy: Secondary | ICD-10-CM | POA: Diagnosis not present

## 2019-05-23 DIAGNOSIS — Z8673 Personal history of transient ischemic attack (TIA), and cerebral infarction without residual deficits: Secondary | ICD-10-CM | POA: Diagnosis not present

## 2019-05-23 DIAGNOSIS — I618 Other nontraumatic intracerebral hemorrhage: Secondary | ICD-10-CM | POA: Diagnosis not present

## 2019-05-23 DIAGNOSIS — Z87891 Personal history of nicotine dependence: Secondary | ICD-10-CM | POA: Insufficient documentation

## 2019-05-23 DIAGNOSIS — E871 Hypo-osmolality and hyponatremia: Secondary | ICD-10-CM | POA: Diagnosis not present

## 2019-05-23 DIAGNOSIS — Z7401 Bed confinement status: Secondary | ICD-10-CM | POA: Diagnosis not present

## 2019-05-23 DIAGNOSIS — R41841 Cognitive communication deficit: Secondary | ICD-10-CM | POA: Diagnosis not present

## 2019-05-23 DIAGNOSIS — D72829 Elevated white blood cell count, unspecified: Secondary | ICD-10-CM | POA: Diagnosis not present

## 2019-05-23 DIAGNOSIS — I10 Essential (primary) hypertension: Secondary | ICD-10-CM | POA: Diagnosis not present

## 2019-05-23 DIAGNOSIS — Z7901 Long term (current) use of anticoagulants: Secondary | ICD-10-CM | POA: Diagnosis not present

## 2019-05-23 DIAGNOSIS — H53462 Homonymous bilateral field defects, left side: Secondary | ICD-10-CM | POA: Diagnosis not present

## 2019-05-23 DIAGNOSIS — Z20822 Contact with and (suspected) exposure to covid-19: Secondary | ICD-10-CM | POA: Diagnosis not present

## 2019-05-23 DIAGNOSIS — I611 Nontraumatic intracerebral hemorrhage in hemisphere, cortical: Secondary | ICD-10-CM | POA: Diagnosis not present

## 2019-05-23 DIAGNOSIS — E785 Hyperlipidemia, unspecified: Secondary | ICD-10-CM | POA: Diagnosis not present

## 2019-05-23 DIAGNOSIS — R531 Weakness: Secondary | ICD-10-CM | POA: Diagnosis present

## 2019-05-23 DIAGNOSIS — R29818 Other symptoms and signs involving the nervous system: Secondary | ICD-10-CM | POA: Diagnosis not present

## 2019-05-23 DIAGNOSIS — H539 Unspecified visual disturbance: Secondary | ICD-10-CM

## 2019-05-23 DIAGNOSIS — I6523 Occlusion and stenosis of bilateral carotid arteries: Secondary | ICD-10-CM | POA: Diagnosis not present

## 2019-05-23 DIAGNOSIS — I615 Nontraumatic intracerebral hemorrhage, intraventricular: Secondary | ICD-10-CM | POA: Diagnosis not present

## 2019-05-23 DIAGNOSIS — R12 Heartburn: Secondary | ICD-10-CM | POA: Diagnosis not present

## 2019-05-23 DIAGNOSIS — Z8679 Personal history of other diseases of the circulatory system: Secondary | ICD-10-CM | POA: Diagnosis not present

## 2019-05-23 DIAGNOSIS — I629 Nontraumatic intracranial hemorrhage, unspecified: Secondary | ICD-10-CM | POA: Insufficient documentation

## 2019-05-23 DIAGNOSIS — D688 Other specified coagulation defects: Secondary | ICD-10-CM | POA: Diagnosis not present

## 2019-05-23 DIAGNOSIS — H538 Other visual disturbances: Secondary | ICD-10-CM | POA: Diagnosis not present

## 2019-05-23 DIAGNOSIS — Z9889 Other specified postprocedural states: Secondary | ICD-10-CM | POA: Diagnosis not present

## 2019-05-23 DIAGNOSIS — R739 Hyperglycemia, unspecified: Secondary | ICD-10-CM | POA: Diagnosis not present

## 2019-05-23 DIAGNOSIS — I619 Nontraumatic intracerebral hemorrhage, unspecified: Secondary | ICD-10-CM | POA: Diagnosis not present

## 2019-05-23 DIAGNOSIS — K219 Gastro-esophageal reflux disease without esophagitis: Secondary | ICD-10-CM | POA: Diagnosis not present

## 2019-05-23 DIAGNOSIS — I739 Peripheral vascular disease, unspecified: Secondary | ICD-10-CM | POA: Diagnosis not present

## 2019-05-23 LAB — COMPREHENSIVE METABOLIC PANEL
ALT: 29 U/L (ref 0–44)
AST: 30 U/L (ref 15–41)
Albumin: 4.2 g/dL (ref 3.5–5.0)
Alkaline Phosphatase: 64 U/L (ref 38–126)
Anion gap: 15 (ref 5–15)
BUN: 11 mg/dL (ref 8–23)
CO2: 22 mmol/L (ref 22–32)
Calcium: 8.7 mg/dL — ABNORMAL LOW (ref 8.9–10.3)
Chloride: 97 mmol/L — ABNORMAL LOW (ref 98–111)
Creatinine, Ser: 1.09 mg/dL (ref 0.61–1.24)
GFR calc Af Amer: >60 mL/min (ref >60)
GFR calc non Af Amer: >60 mL/min (ref >60)
Glucose, Bld: 167 mg/dL — ABNORMAL HIGH (ref 70–99)
Potassium: 3.5 mmol/L (ref 3.5–5.1)
Sodium: 134 mmol/L — ABNORMAL LOW (ref 135–145)
Total Bilirubin: 1.1 mg/dL (ref 0.3–1.2)
Total Protein: 7.6 g/dL (ref 6.5–8.1)

## 2019-05-23 LAB — GLUCOSE, CAPILLARY: Glucose-Capillary: 156 mg/dL — ABNORMAL HIGH (ref 70–99)

## 2019-05-23 LAB — APTT: aPTT: 40 seconds — ABNORMAL HIGH (ref 24–36)

## 2019-05-23 LAB — PROTIME-INR
INR: 2.1 — ABNORMAL HIGH (ref 0.8–1.2)
Prothrombin Time: 23.7 seconds — ABNORMAL HIGH (ref 11.4–15.2)

## 2019-05-23 LAB — RESPIRATORY PANEL BY RT PCR (FLU A&B, COVID)
Influenza A by PCR: NEGATIVE
Influenza B by PCR: NEGATIVE
SARS Coronavirus 2 by RT PCR: NEGATIVE

## 2019-05-23 LAB — CBC
HCT: 45.5 % (ref 39.0–52.0)
Hemoglobin: 15.5 g/dL (ref 13.0–17.0)
MCH: 30.3 pg (ref 26.0–34.0)
MCHC: 34.1 g/dL (ref 30.0–36.0)
MCV: 88.9 fL (ref 80.0–100.0)
Platelets: 222 10*3/uL (ref 150–400)
RBC: 5.12 MIL/uL (ref 4.22–5.81)
RDW: 13 % (ref 11.5–15.5)
WBC: 12.6 10*3/uL — ABNORMAL HIGH (ref 4.0–10.5)
nRBC: 0 % (ref 0.0–0.2)

## 2019-05-23 LAB — DIFFERENTIAL
Abs Immature Granulocytes: 0.05 10*3/uL (ref 0.00–0.07)
Basophils Absolute: 0 10*3/uL (ref 0.0–0.1)
Basophils Relative: 0 %
Eosinophils Absolute: 0 10*3/uL (ref 0.0–0.5)
Eosinophils Relative: 0 %
Immature Granulocytes: 0 %
Lymphocytes Relative: 8 %
Lymphs Abs: 1 10*3/uL (ref 0.7–4.0)
Monocytes Absolute: 0.4 10*3/uL (ref 0.1–1.0)
Monocytes Relative: 3 %
Neutro Abs: 11.2 10*3/uL — ABNORMAL HIGH (ref 1.7–7.7)
Neutrophils Relative %: 89 %

## 2019-05-23 MED ORDER — PROTHROMBIN COMPLEX CONC HUMAN 500 UNITS IV KIT
1500.0000 [IU] | PACK | Status: AC
  Administered 2019-05-23: 1500 [IU] via INTRAVENOUS
  Filled 2019-05-23: qty 500

## 2019-05-23 MED ORDER — ONDANSETRON HCL 4 MG/2ML IJ SOLN
4.00 | INTRAMUSCULAR | Status: DC
Start: ? — End: 2019-05-23

## 2019-05-23 MED ORDER — FINASTERIDE 5 MG PO TABS
5.00 | ORAL_TABLET | ORAL | Status: DC
Start: 2019-05-27 — End: 2019-05-23

## 2019-05-23 MED ORDER — METOPROLOL TARTRATE 25 MG PO TABS
25.00 | ORAL_TABLET | ORAL | Status: DC
Start: 2019-05-26 — End: 2019-05-23

## 2019-05-23 MED ORDER — NICARDIPINE HCL IN NACL 20-0.86 MG/200ML-% IV SOLN
3.0000 mg/h | INTRAVENOUS | Status: DC
  Administered 2019-05-23: 5 mg/h via INTRAVENOUS
  Filled 2019-05-23 (×2): qty 200

## 2019-05-23 MED ORDER — VITAMIN K1 10 MG/ML IJ SOLN
10.0000 mg | Freq: Once | INTRAVENOUS | Status: AC
  Administered 2019-05-23: 10 mg via INTRAVENOUS
  Filled 2019-05-23: qty 1

## 2019-05-23 MED ORDER — ACETAMINOPHEN 325 MG PO TABS
650.00 | ORAL_TABLET | ORAL | Status: DC
Start: ? — End: 2019-05-23

## 2019-05-23 MED ORDER — HYDRALAZINE HCL 20 MG/ML IJ SOLN
10.00 | INTRAMUSCULAR | Status: DC
Start: ? — End: 2019-05-23

## 2019-05-23 MED ORDER — AMLODIPINE BESYLATE 5 MG PO TABS
5.00 | ORAL_TABLET | ORAL | Status: DC
Start: 2019-05-27 — End: 2019-05-23

## 2019-05-23 MED ORDER — LABETALOL HCL 5 MG/ML IV SOLN
10.00 | INTRAVENOUS | Status: DC
Start: ? — End: 2019-05-23

## 2019-05-23 MED ORDER — ATORVASTATIN CALCIUM 80 MG PO TABS
80.00 | ORAL_TABLET | ORAL | Status: DC
Start: 2019-05-27 — End: 2019-05-23

## 2019-05-23 MED ORDER — NICARDIPINE HCL IN DEXTROSE 40-5 MG/200ML-% IV SOLN
3.00 | INTRAVENOUS | Status: DC
Start: ? — End: 2019-05-23

## 2019-05-23 MED ORDER — GENERIC EXTERNAL MEDICATION
Status: DC
Start: ? — End: 2019-05-23

## 2019-05-23 MED ORDER — SODIUM CHLORIDE 0.9 % IV SOLN
INTRAVENOUS | Status: DC
Start: ? — End: 2019-05-23

## 2019-05-23 MED ORDER — LIDOCAINE HCL 1 % IJ SOLN
0.50 | INTRAMUSCULAR | Status: DC
Start: ? — End: 2019-05-23

## 2019-05-23 MED ORDER — SODIUM CHLORIDE 0.9% FLUSH: 3.0000 mL | Freq: Once | INTRAVENOUS | Status: DC

## 2019-05-23 MED ORDER — LOSARTAN POTASSIUM 50 MG PO TABS
100.00 | ORAL_TABLET | ORAL | Status: DC
Start: 2019-05-27 — End: 2019-05-23

## 2019-05-23 MED ORDER — PANTOPRAZOLE SODIUM 40 MG PO TBEC
40.00 | DELAYED_RELEASE_TABLET | ORAL | Status: DC
Start: 2019-05-26 — End: 2019-05-23

## 2019-05-23 NOTE — ED Notes (Signed)
Transfer signature witnessed by this RN

## 2019-05-23 NOTE — ED Notes (Signed)
Report to [employer]

## 2019-05-23 NOTE — ED Notes (Signed)
Report given to [facility] ED Charge RN

## 2019-05-23 NOTE — ED Triage Notes (Addendum)
Wife now reports at dinner last night at 6 he was drooling out the right side of his mouth.  Vision and weakness started today per pt. Pt is on coumadin.  No falls per wife.

## 2019-05-23 NOTE — ED Notes (Signed)
MD Goodman at bedside. 

## 2019-05-23 NOTE — ED Triage Notes (Addendum)
FIRST NURSE NOTE- pt woke up at 800 th is morning and reports at 830 lost part of his visual field.  Pt right grip weaker than left and visual change.  Outside TPA window but concern for LVO.  Pt has stroke hx.  Charge RN informed of code stroke.  Wife reports he has had trouble moving right side/lifting with it.

## 2019-05-23 NOTE — ED Notes (Signed)
emtala checked by charge rn

## 2019-05-23 NOTE — ED Provider Notes (Signed)
[facility] Emergency Department Provider Note   ____________________________________________   I have reviewed the triage vital signs and the nursing notes.   HISTORY  Chief Complaint Cerebrovascular Accident   History limited by: Not Limited   HPI Kenneth Hawkins is a 74 y.o. male who presents to the emergency department today because of concern for vision change and weakness. The patient first noticed some symptoms about 30 minutes after waking up this morning. Feels like he is not noticing things with his vision, for example when I walked into the room he did not register it. Wife says that yesterday she noticed that he did have some drooling while eating breakfast and had some imbalance with walking, however she felt those symptoms were improved this morning. When she got home from work she noticed his weakness again. The patient does state he has had a headache since this morning. Has a history of stroke and states he is on warfarin.    Records reviewed. Per medical record review patient has a history of CVA, on warfarin.  Past Medical History:  Diagnosis Date  . Anemia   . BPH (benign prostatic hyperplasia)   . BPH with obstruction/lower urinary tract symptoms   . CVA (cerebral vascular accident) ([place])   . ED (erectile dysfunction)   . GERD (gastroesophageal reflux disease)   . History of TIAs   . Hyperglycemia   . Hyperlipemia   . Hypertension   . Leg edema   . Peripheral vascular disease ([place])   . Prostatitis     Patient Active Problem List   Diagnosis Date Noted  . BPH (benign prostatic hyperplasia) 12/06/2017  . Anemia 12/02/2015  . Erectile dysfunction 12/02/2015  . GERD (gastroesophageal reflux disease) 12/02/2015  . History of recurrent TIAs 12/02/2015  . Hyperglycemia 12/02/2015  . Hyperlipidemia 12/02/2015  . Hypertension 12/02/2015  . PVD (peripheral vascular disease) ([place]) 12/02/2015    Past Surgical History:  Procedure  Laterality Date  . COLONOSCOPY WITH ESOPHAGOGASTRODUODENOSCOPY (EGD)    . COLONOSCOPY WITH PROPOFOL N/A 02/01/2015   Procedure: COLONOSCOPY WITH PROPOFOL;  Surgeon: Lollie Sails, MD;  Location[facility] ENDOSCOPY;  Service: Endoscopy;  Laterality: N/A;  . COLONOSCOPY WITH PROPOFOL  04/25/2018   Procedure: COLONOSCOPY WITH PROPOFOL;  Surgeon: Lollie Sails, MD;  Location: [facility] ENDOSCOPY;  Service: Endoscopy;;  . ESOPHAGOGASTRODUODENOSCOPY (EGD) WITH PROPOFOL N/A 04/25/2018   Procedure: ESOPHAGOGASTRODUODENOSCOPY (EGD) WITH PROPOFOL;  Surgeon: Lollie Sails, MD;  Location: [facility] ENDOSCOPY;  Service: Endoscopy;  Laterality: N/A;    Prior to Admission medications   Medication Sig Start Date End Date Taking? Authorizing Provider  amLODipine (NORVASC) 5 MG tablet Take by mouth. 09/25/16 09/25/17  [provider]  aspirin EC 81 MG tablet Take by mouth.    [provider]  atorvastatin (LIPITOR) 80 MG tablet Take by mouth. 03/09/16 03/09/17  [provider]  ferrous sulfate 325 (65 FE) MG EC tablet Take 325 mg by mouth 3 (three) times daily with meals.    [provider]  finasteride (PROSCAR) 5 MG tablet Take 1 tablet (5 mg total) by mouth daily. 01/01/19   Billey Co, MD  furosemide (LASIX) 40 MG tablet Take by mouth. 07/05/16 07/05/17  [provider]  losartan-hydrochlorothiazide (HYZAAR) 100-25 MG per tablet Take by mouth. 04/23/14 04/23/15  [provider]  metoprolol tartrate (LOPRESSOR) 25 MG tablet Take by mouth. 08/22/16 08/22/17  [provider]  omeprazole (PRILOSEC) 20 MG capsule TAKE 1 CAPSULE EVERY  DAY 09/18/14   [provider]  ondansetron (ZOFRAN ODT) 4 MG disintegrating tablet Take 1 tablet (4 mg total) by mouth every 8 (eight) hours as needed for nausea or vomiting. 05/20/15   Carrie Mew, MD  potassium chloride (K-DUR) 10 MEQ tablet Take by mouth. 07/18/16 07/18/17  [provider]  ranitidine (ZANTAC)  150 MG capsule Take 1 capsule (150 mg total) by mouth 2 (two) times daily. 05/20/15   Carrie Mew, MD  sildenafil (REVATIO) 20 MG tablet Take 1 to 5 tabs PO daily prn 12/01/16   Nickie Retort, MD  sildenafil (VIAGRA) 100 MG tablet Take by mouth.    [provider]  sildenafil (VIAGRA) 100 MG tablet Take 1 tablet (100 mg total) by mouth daily as needed for erectile dysfunction. 12/01/14   Collier Flowers, MD  simvastatin (ZOCOR) 80 MG tablet TAKE 1 TABLET ONE TIME DAILY 03/02/14   [provider]  tadalafil (ADCIRCA/CIALIS) 20 MG tablet Take 1 tablet (20 mg total) by mouth daily as needed for erectile dysfunction. 12/06/17   Billey Co, MD  warfarin (COUMADIN) 3 MG tablet TAKE 1 TABLET EVERY DAY 10/15/14   [provider]    Allergies Ace inhibitors  Family History  Problem Relation Age of Onset  . Kidney cancer Neg Hx   . Bladder Cancer Neg Hx   . Prostate cancer Neg Hx     Social History Social History   Tobacco Use  . Smoking status: Former Smoker    Quit date: 2005    Years since quitting: 16.1  . Smokeless tobacco: Never Used  Substance Use Topics  . Alcohol use: No    Alcohol/week: 0.0 standard drinks  . Drug use: No    Review of Systems Constitutional: No fever/chills Eyes: Positive for vision change.  ENT: No sore throat. Cardiovascular: Denies chest pain. Respiratory: Denies shortness of breath. Gastrointestinal: No abdominal pain.  No nausea, no vomiting.  No diarrhea.   Genitourinary: Negative for dysuria. Musculoskeletal: Negative for back pain. Skin: Negative for rash. Neurological: Positive for right sided weakness, positive for vision change. Positive for headache.  ____________________________________________   PHYSICAL EXAM:  VITAL SIGNS: ED Triage Vitals  Enc Vitals Group     BP 05/23/19 1753 (!) 185/89     Pulse Rate 05/23/19 1753 94     Resp 05/23/19 1753 15     Temp 05/23/19 1753 98.1 F (36.7 C)      Temp Source 05/23/19 1753 Oral     SpO2 05/23/19 1753 98 %     Weight 05/23/19 1748 195 lb (88.5 kg)     Height 05/23/19 1748 6' (1.829 m)     Head Circumference --      Peak Flow --      Pain Score 05/23/19 1748 0   Constitutional: Alert and oriented.  Eyes: Conjunctivae are normal.  ENT      Head: Normocephalic and atraumatic.      Nose: No congestion/rhinnorhea.      Mouth/Throat: Mucous membranes are moist.      Neck: No stridor. Hematological/Lymphatic/Immunilogical: No cervical lymphadenopathy. Cardiovascular: Normal rate, regular rhythm.  No murmurs, rubs, or gallops.  Respiratory: Normal respiratory effort without tachypnea nor retractions. Breath sounds are clear and equal bilaterally. No wheezes/rales/rhonchi. Gastrointestinal: Soft and non tender. No rebound. No guarding.  Genitourinary: Deferred Musculoskeletal: Normal range of motion in all extremities. No lower extremity edema. Neurologic:  Normal speech and language. No gross focal neurologic deficits  are appreciated.  Skin:  Skin is warm, dry and intact. No rash noted. Psychiatric: Mood and affect are normal. Speech and behavior are normal. Patient exhibits appropriate insight and judgment.  ____________________________________________    LABS (pertinent positives/negatives)  INR 2.1 CMP na 134, k 3.5, glu 167, cr 1.09 CBC wbc 12.6, hgb 15.5, plt 222  ____________________________________________   EKG  I, Nance Pear, attending physician, personally viewed and interpreted this EKG  EKG Time: 1753 Rate: 90 Rhythm: sinus rhythm Axis: normal Intervals: qtc 437 QRS: narrow ST changes: no st elevation Impression: normal ekg  ____________________________________________    RADIOLOGY  CT head Acute parenchymal hemorrhage. No midline shift.   I, Nance Pear, personally discussed these images and results by phone with the on-call radiologist and used this discussion as part of my medical  decision making.   ____________________________________________   PROCEDURES  Procedures  CRITICAL CARE Performed by: Nance Pear   Total critical care time: 35 minutes  Critical care time was exclusive of separately billable procedures and treating other patients.  Critical care was necessary to treat or prevent imminent or life-threatening deterioration.  Critical care was time spent personally by me on the following activities: development of treatment plan with patient and/or surrogate as well as nursing, discussions with consultants, evaluation of patient's response to treatment, examination of patient, obtaining history from patient or surrogate, ordering and performing treatments and interventions, ordering and review of laboratory studies, ordering and review of radiographic studies, pulse oximetry and re-evaluation of patient's condition.  ____________________________________________   INITIAL IMPRESSION / ASSESSMENT AND PLAN / ED COURSE  Pertinent labs & imaging results that were available during my care of the patient were reviewed by me and considered in my medical decision making (see chart for details).   Patient presented to the emergency department today because of concerns for change in his vision.  Wife also has noticed some imbalance in the patient's gait and right facial weakness.  CT scan was quickly obtained which was consistent with an intraparenchymal bleed.  I discussed this with the patient and wife.  Given that the patient does take Coumadin IV vitamin K and Kcentra was ordered.  Additionally patient was hypertensive here so was started on a nicardipine drip to obtain better blood pressure control.  Per patient's request [facility] with [facility].  [facility] did accept patient in transfer.  ____________________________________________   FINAL CLINICAL IMPRESSION(S) / ED DIAGNOSES  Final diagnoses:  Intracranial bleed ([place])   Vision changes     Note: This dictation was prepared with Dragon dictation. Any transcriptional errors that result from this process are unintentional     Nance Pear, MD 05/23/19 2345

## 2019-05-23 NOTE — ED Triage Notes (Signed)
Pt reports this morning he woke up feeling fine, 30 minutes later having difficulty seeing out of both eyes, "having trouble piecing things together". Reports headache starting this morning. Denies weakness.

## 2019-05-23 NOTE — ED Notes (Signed)
Jacket, shirt and cellphone sent with pt

## 2019-05-23 NOTE — ED Triage Notes (Signed)
CT aware of pt and will go to CT after vital.

## 2019-05-23 NOTE — ED Notes (Signed)
Wife at bedside. Dr Archie Balboa to bedside to discuss plan of care

## 2019-05-24 DIAGNOSIS — Z9889 Other specified postprocedural states: Secondary | ICD-10-CM | POA: Diagnosis not present

## 2019-05-24 DIAGNOSIS — I10 Essential (primary) hypertension: Secondary | ICD-10-CM | POA: Diagnosis not present

## 2019-05-24 DIAGNOSIS — D688 Other specified coagulation defects: Secondary | ICD-10-CM | POA: Diagnosis not present

## 2019-05-24 DIAGNOSIS — I615 Nontraumatic intracerebral hemorrhage, intraventricular: Secondary | ICD-10-CM | POA: Diagnosis not present

## 2019-05-24 DIAGNOSIS — I611 Nontraumatic intracerebral hemorrhage in hemisphere, cortical: Secondary | ICD-10-CM | POA: Diagnosis not present

## 2019-05-24 DIAGNOSIS — I619 Nontraumatic intracerebral hemorrhage, unspecified: Secondary | ICD-10-CM | POA: Diagnosis not present

## 2019-05-24 DIAGNOSIS — Z8673 Personal history of transient ischemic attack (TIA), and cerebral infarction without residual deficits: Secondary | ICD-10-CM | POA: Diagnosis not present

## 2019-05-24 DIAGNOSIS — K219 Gastro-esophageal reflux disease without esophagitis: Secondary | ICD-10-CM | POA: Diagnosis not present

## 2019-05-24 DIAGNOSIS — R41841 Cognitive communication deficit: Secondary | ICD-10-CM | POA: Diagnosis not present

## 2019-05-24 DIAGNOSIS — R12 Heartburn: Secondary | ICD-10-CM | POA: Diagnosis not present

## 2019-05-24 DIAGNOSIS — D72829 Elevated white blood cell count, unspecified: Secondary | ICD-10-CM | POA: Diagnosis not present

## 2019-05-26 DIAGNOSIS — R41841 Cognitive communication deficit: Secondary | ICD-10-CM | POA: Diagnosis not present

## 2019-05-26 DIAGNOSIS — I611 Nontraumatic intracerebral hemorrhage in hemisphere, cortical: Secondary | ICD-10-CM | POA: Diagnosis not present

## 2019-05-26 DIAGNOSIS — I619 Nontraumatic intracerebral hemorrhage, unspecified: Secondary | ICD-10-CM | POA: Diagnosis not present

## 2019-05-28 MED ORDER — POLYETHYLENE GLYCOL 3350 17 GM/SCOOP PO POWD
17.00 | ORAL | Status: DC
Start: 2019-05-27 — End: 2019-05-28

## 2019-05-28 MED ORDER — CALCIUM CARBONATE ANTACID 750 MG PO CHEW
CHEWABLE_TABLET | ORAL | Status: DC
Start: ? — End: 2019-05-28

## 2019-05-28 MED ORDER — LABETALOL HCL 5 MG/ML IV SOLN
10.00 | INTRAVENOUS | Status: DC
Start: ? — End: 2019-05-28

## 2019-05-28 MED ORDER — HEPARIN SODIUM (PORCINE) 5000 UNIT/ML IJ SOLN
5000.00 | INTRAMUSCULAR | Status: DC
Start: 2019-05-26 — End: 2019-05-28

## 2019-05-28 MED ORDER — HYDRALAZINE HCL 20 MG/ML IJ SOLN
10.00 | INTRAMUSCULAR | Status: DC
Start: ? — End: 2019-05-28

## 2019-05-28 MED ORDER — SENNOSIDES-DOCUSATE SODIUM 8.6-50 MG PO TABS
2.00 | ORAL_TABLET | ORAL | Status: DC
Start: 2019-05-27 — End: 2019-05-28

## 2019-06-09 DIAGNOSIS — Z7902 Long term (current) use of antithrombotics/antiplatelets: Secondary | ICD-10-CM | POA: Diagnosis not present

## 2019-06-09 DIAGNOSIS — I611 Nontraumatic intracerebral hemorrhage in hemisphere, cortical: Secondary | ICD-10-CM | POA: Diagnosis not present

## 2019-06-09 DIAGNOSIS — I10 Essential (primary) hypertension: Secondary | ICD-10-CM | POA: Diagnosis not present

## 2019-06-09 DIAGNOSIS — H53462 Homonymous bilateral field defects, left side: Secondary | ICD-10-CM | POA: Diagnosis not present

## 2019-06-09 DIAGNOSIS — I739 Peripheral vascular disease, unspecified: Secondary | ICD-10-CM | POA: Diagnosis not present

## 2019-06-09 DIAGNOSIS — R739 Hyperglycemia, unspecified: Secondary | ICD-10-CM | POA: Diagnosis not present

## 2019-06-09 DIAGNOSIS — Z8673 Personal history of transient ischemic attack (TIA), and cerebral infarction without residual deficits: Secondary | ICD-10-CM | POA: Diagnosis not present

## 2019-06-09 DIAGNOSIS — E7849 Other hyperlipidemia: Secondary | ICD-10-CM | POA: Diagnosis not present

## 2019-06-09 DIAGNOSIS — D649 Anemia, unspecified: Secondary | ICD-10-CM | POA: Diagnosis not present

## 2019-06-11 ENCOUNTER — Ambulatory Visit: Payer: Self-pay | Admitting: Dermatology

## 2019-06-24 DIAGNOSIS — D649 Anemia, unspecified: Secondary | ICD-10-CM | POA: Diagnosis not present

## 2019-06-24 DIAGNOSIS — E7849 Other hyperlipidemia: Secondary | ICD-10-CM | POA: Diagnosis not present

## 2019-06-24 DIAGNOSIS — R739 Hyperglycemia, unspecified: Secondary | ICD-10-CM | POA: Diagnosis not present

## 2019-06-24 DIAGNOSIS — I739 Peripheral vascular disease, unspecified: Secondary | ICD-10-CM | POA: Diagnosis not present

## 2019-07-01 DIAGNOSIS — K219 Gastro-esophageal reflux disease without esophagitis: Secondary | ICD-10-CM | POA: Diagnosis not present

## 2019-07-01 DIAGNOSIS — I10 Essential (primary) hypertension: Secondary | ICD-10-CM | POA: Diagnosis not present

## 2019-07-01 DIAGNOSIS — Z125 Encounter for screening for malignant neoplasm of prostate: Secondary | ICD-10-CM | POA: Diagnosis not present

## 2019-07-01 DIAGNOSIS — Z87891 Personal history of nicotine dependence: Secondary | ICD-10-CM | POA: Diagnosis not present

## 2019-07-01 DIAGNOSIS — R739 Hyperglycemia, unspecified: Secondary | ICD-10-CM | POA: Diagnosis not present

## 2019-07-01 DIAGNOSIS — E7849 Other hyperlipidemia: Secondary | ICD-10-CM | POA: Diagnosis not present

## 2019-07-01 DIAGNOSIS — I739 Peripheral vascular disease, unspecified: Secondary | ICD-10-CM | POA: Diagnosis not present

## 2019-07-01 DIAGNOSIS — D649 Anemia, unspecified: Secondary | ICD-10-CM | POA: Diagnosis not present

## 2019-07-15 DIAGNOSIS — Z87891 Personal history of nicotine dependence: Secondary | ICD-10-CM | POA: Diagnosis not present

## 2019-07-15 DIAGNOSIS — I611 Nontraumatic intracerebral hemorrhage in hemisphere, cortical: Secondary | ICD-10-CM | POA: Diagnosis not present

## 2019-07-15 DIAGNOSIS — I10 Essential (primary) hypertension: Secondary | ICD-10-CM | POA: Diagnosis not present

## 2019-07-29 DIAGNOSIS — H2513 Age-related nuclear cataract, bilateral: Secondary | ICD-10-CM | POA: Diagnosis not present

## 2019-08-08 DIAGNOSIS — I611 Nontraumatic intracerebral hemorrhage in hemisphere, cortical: Secondary | ICD-10-CM | POA: Diagnosis not present

## 2019-08-14 DIAGNOSIS — I611 Nontraumatic intracerebral hemorrhage in hemisphere, cortical: Secondary | ICD-10-CM | POA: Diagnosis not present

## 2019-10-08 ENCOUNTER — Encounter: Payer: Self-pay | Admitting: Urology

## 2019-10-17 DIAGNOSIS — I611 Nontraumatic intracerebral hemorrhage in hemisphere, cortical: Secondary | ICD-10-CM | POA: Diagnosis not present

## 2020-01-01 ENCOUNTER — Ambulatory Visit: Payer: Medicare HMO | Admitting: Urology

## 2020-01-02 DIAGNOSIS — E7849 Other hyperlipidemia: Secondary | ICD-10-CM | POA: Diagnosis not present

## 2020-01-02 DIAGNOSIS — D649 Anemia, unspecified: Secondary | ICD-10-CM | POA: Diagnosis not present

## 2020-01-02 DIAGNOSIS — R739 Hyperglycemia, unspecified: Secondary | ICD-10-CM | POA: Diagnosis not present

## 2020-01-02 DIAGNOSIS — R600 Localized edema: Secondary | ICD-10-CM | POA: Diagnosis not present

## 2020-01-02 DIAGNOSIS — I10 Essential (primary) hypertension: Secondary | ICD-10-CM | POA: Diagnosis not present

## 2020-01-02 DIAGNOSIS — Z125 Encounter for screening for malignant neoplasm of prostate: Secondary | ICD-10-CM | POA: Diagnosis not present

## 2020-01-08 ENCOUNTER — Encounter: Payer: Self-pay | Admitting: Urology

## 2020-01-08 ENCOUNTER — Other Ambulatory Visit: Payer: Self-pay

## 2020-01-08 ENCOUNTER — Ambulatory Visit: Payer: Medicare HMO | Admitting: Urology

## 2020-01-08 VITALS — BP 179/77 | HR 66 | Ht 72.0 in | Wt 190.0 lb

## 2020-01-08 DIAGNOSIS — N400 Enlarged prostate without lower urinary tract symptoms: Secondary | ICD-10-CM | POA: Diagnosis not present

## 2020-01-08 DIAGNOSIS — N529 Male erectile dysfunction, unspecified: Secondary | ICD-10-CM | POA: Diagnosis not present

## 2020-01-08 LAB — BLADDER SCAN AMB NON-IMAGING

## 2020-01-08 MED ORDER — TADALAFIL 20 MG PO TABS: 20.0000 mg | ORAL_TABLET | Freq: Every day | ORAL | 11 refills | Status: DC | PRN

## 2020-01-08 MED ORDER — FINASTERIDE 5 MG PO TABS: 5.0000 mg | ORAL_TABLET | Freq: Every day | ORAL | 3 refills | Status: DC

## 2020-01-08 NOTE — Patient Instructions (Signed)
Tadalafil tablets (Cialis) What is this medicine? TADALAFIL (tah DA la fil) is used to treat erection problems in men. It is also used for enlargement of the prostate gland in men, a condition called benign prostatic hyperplasia or BPH. This medicine improves urine flow and reduces BPH symptoms. This medicine can also treat both erection problems and BPH when they occur together. This medicine may be used for other purposes; ask your health care provider or pharmacist if you have questions. COMMON BRAND NAME(S): Adcirca, ALYQ, Cialis What should I tell my health care provider before I take this medicine? They need to know if you have any of these conditions:  bleeding disorders  eye or vision problems, including a rare inherited eye disease called retinitis pigmentosa  anatomical deformation of the penis, Peyronie's disease, or history of priapism (painful and prolonged erection)  heart disease, angina, a history of heart attack, irregular heart beats, or other heart problems  high or low blood pressure  history of blood diseases, like sickle cell anemia or leukemia  history of stomach bleeding  kidney disease  liver disease  stroke  an unusual or allergic reaction to tadalafil, other medicines, foods, dyes, or preservatives  pregnant or trying to get pregnant  breast-feeding How should I use this medicine? Take this medicine by mouth with a glass of water. Follow the directions on the prescription label. You may take this medicine with or without meals. When this medicine is used for erection problems, your doctor may prescribe it to be taken once daily or as needed. If you are taking the medicine as needed, you may be able to have sexual activity 30 minutes after taking it and for up to 36 hours after taking it. Whether you are taking the medicine as needed or once daily, you should not take more than one dose per day. If you are taking this medicine for symptoms of benign  prostatic hyperplasia (BPH) or to treat both BPH and an erection problem, take the dose once daily at about the same time each day. Do not take your medicine more often than directed. Talk to your pediatrician regarding the use of this medicine in children. Special care may be needed. Overdosage: If you think you have taken too much of this medicine contact a poison control center or emergency room at once. NOTE: This medicine is only for you. Do not share this medicine with others. What if I miss a dose? If you are taking this medicine as needed for erection problems, this does not apply. If you miss a dose while taking this medicine once daily for an erection problem, benign prostatic hyperplasia, or both, take it as soon as you remember, but do not take more than one dose per day. What may interact with this medicine? Do not take this medicine with any of the following medications:  nitrates like amyl nitrite, isosorbide dinitrate, isosorbide mononitrate, nitroglycerin  other medicines for erectile dysfunction like avanafil, sildenafil, vardenafil  other tadalafil products (Adcirca)  riociguat This medicine may also interact with the following medications:  certain drugs for high blood pressure  certain drugs for the treatment of HIV infection or AIDS  certain drugs used for fungal or yeast infections, like fluconazole, itraconazole, ketoconazole, and voriconazole  certain drugs used for seizures like carbamazepine, phenytoin, and phenobarbital  grapefruit juice  macrolide antibiotics like clarithromycin, erythromycin, troleandomycin  medicines for prostate problems  rifabutin, rifampin or rifapentine This list may not describe all possible interactions. Give your   health care provider a list of all the medicines, herbs, non-prescription drugs, or dietary supplements you use. Also tell them if you smoke, drink alcohol, or use illegal drugs. Some items may interact with your  medicine. What should I watch for while using this medicine? If you notice any changes in your vision while taking this drug, call your doctor or health care professional as soon as possible. Stop using this medicine and call your health care provider right away if you have a loss of sight in one or both eyes. Contact your doctor or health care professional right away if the erection lasts longer than 4 hours or if it becomes painful. This may be a sign of serious problem and must be treated right away to prevent permanent damage. If you experience symptoms of nausea, dizziness, chest pain or arm pain upon initiation of sexual activity after taking this medicine, you should refrain from further activity and call your doctor or health care professional as soon as possible. Do not drink alcohol to excess (examples, 5 glasses of wine or 5 shots of whiskey) when taking this medicine. When taken in excess, alcohol can increase your chances of getting a headache or getting dizzy, increasing your heart rate or lowering your blood pressure. Using this medicine does not protect you or your partner against HIV infection (the virus that causes AIDS) or other sexually transmitted diseases. What side effects may I notice from receiving this medicine? Side effects that you should report to your doctor or health care professional as soon as possible:  allergic reactions like skin rash, itching or hives, swelling of the face, lips, or tongue  breathing problems  changes in hearing  changes in vision  chest pain  fast, irregular heartbeat  prolonged or painful erection  seizures Side effects that usually do not require medical attention (report to your doctor or health care professional if they continue or are bothersome):  back pain  dizziness  flushing  headache  indigestion  muscle aches  nausea  stuffy or runny nose This list may not describe all possible side effects. Call your doctor  for medical advice about side effects. You may report side effects to FDA at 1-800-FDA-1088. Where should I keep my medicine? Keep out of the reach of children. Store at room temperature between 15 and 30 degrees C (59 and 86 degrees F). Throw away any unused medicine after the expiration date. NOTE: This sheet is a summary. It may not cover all possible information. If you have questions about this medicine, talk to your doctor, pharmacist, or health care provider.  2020 Elsevier/Gold Standard (2013-07-25 13:15:49)  

## 2020-01-08 NOTE — Progress Notes (Signed)
   01/08/2020 4:47 PM   Kenneth Hawkins 1946-01-18 [id number]  Reason for visit: Follow up BPH and ED  HPI: I saw Kenneth Hawkins back in urology clinic today for follow-up of the above issues.  He is a 74 year old male who has mild urinary symptoms that are well controlled on finasteride, and have been stable over the last year.  We elected to discontinue PSA screening previously secondary to his age and the AUA guidelines.  He really denies any significant urinary complaints today, and IPSS score is 3, with quality of life pleased.  PVR is 159 mL.  He also has had trouble with erections, and we previously discussed sildenafil until data fill for ED.  He has not taken these medications recently as his wife has been sick, but she is doing better lately.  He is interested in trying medications for ED.  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  He remains on anticoagulation with Eliquis for atrial fibrillation.  Continue finasteride for BPH, refilled Trial of Cialis 5 to 10 mg on demand for ED RTC 1 year symptom check   Billey Co, MD  [facility] [address], [place], [place] [address] [phone]

## 2020-01-09 DIAGNOSIS — Z0000 Encounter for general adult medical examination without abnormal findings: Secondary | ICD-10-CM | POA: Diagnosis not present

## 2020-01-09 DIAGNOSIS — I739 Peripheral vascular disease, unspecified: Secondary | ICD-10-CM | POA: Diagnosis not present

## 2020-01-09 DIAGNOSIS — Z23 Encounter for immunization: Secondary | ICD-10-CM | POA: Diagnosis not present

## 2020-01-09 DIAGNOSIS — Z8679 Personal history of other diseases of the circulatory system: Secondary | ICD-10-CM | POA: Diagnosis not present

## 2020-01-09 DIAGNOSIS — I480 Paroxysmal atrial fibrillation: Secondary | ICD-10-CM | POA: Diagnosis not present

## 2020-01-09 DIAGNOSIS — D649 Anemia, unspecified: Secondary | ICD-10-CM | POA: Diagnosis not present

## 2020-01-09 DIAGNOSIS — R739 Hyperglycemia, unspecified: Secondary | ICD-10-CM | POA: Diagnosis not present

## 2020-01-09 DIAGNOSIS — E7849 Other hyperlipidemia: Secondary | ICD-10-CM | POA: Diagnosis not present

## 2020-01-09 DIAGNOSIS — I10 Essential (primary) hypertension: Secondary | ICD-10-CM | POA: Diagnosis not present

## 2020-02-17 DIAGNOSIS — H2513 Age-related nuclear cataract, bilateral: Secondary | ICD-10-CM | POA: Diagnosis not present

## 2020-02-17 DIAGNOSIS — I6930 Unspecified sequelae of cerebral infarction: Secondary | ICD-10-CM | POA: Diagnosis not present

## 2020-07-01 DIAGNOSIS — I10 Essential (primary) hypertension: Secondary | ICD-10-CM | POA: Diagnosis not present

## 2020-07-01 DIAGNOSIS — E7849 Other hyperlipidemia: Secondary | ICD-10-CM | POA: Diagnosis not present

## 2020-07-01 DIAGNOSIS — D649 Anemia, unspecified: Secondary | ICD-10-CM | POA: Diagnosis not present

## 2020-07-01 DIAGNOSIS — R739 Hyperglycemia, unspecified: Secondary | ICD-10-CM | POA: Diagnosis not present

## 2020-07-08 DIAGNOSIS — D649 Anemia, unspecified: Secondary | ICD-10-CM | POA: Diagnosis not present

## 2020-07-08 DIAGNOSIS — I10 Essential (primary) hypertension: Secondary | ICD-10-CM | POA: Diagnosis not present

## 2020-07-08 DIAGNOSIS — R739 Hyperglycemia, unspecified: Secondary | ICD-10-CM | POA: Diagnosis not present

## 2020-07-08 DIAGNOSIS — R600 Localized edema: Secondary | ICD-10-CM | POA: Diagnosis not present

## 2020-07-08 DIAGNOSIS — I739 Peripheral vascular disease, unspecified: Secondary | ICD-10-CM | POA: Diagnosis not present

## 2020-07-08 DIAGNOSIS — I480 Paroxysmal atrial fibrillation: Secondary | ICD-10-CM | POA: Diagnosis not present

## 2020-07-08 DIAGNOSIS — E7849 Other hyperlipidemia: Secondary | ICD-10-CM | POA: Diagnosis not present

## 2020-07-08 DIAGNOSIS — D6869 Other thrombophilia: Secondary | ICD-10-CM | POA: Diagnosis not present

## 2020-07-08 DIAGNOSIS — K219 Gastro-esophageal reflux disease without esophagitis: Secondary | ICD-10-CM | POA: Diagnosis not present

## 2020-07-14 DIAGNOSIS — D649 Anemia, unspecified: Secondary | ICD-10-CM | POA: Diagnosis not present

## 2020-07-23 DIAGNOSIS — D649 Anemia, unspecified: Secondary | ICD-10-CM | POA: Diagnosis not present

## 2020-10-18 DIAGNOSIS — I6930 Unspecified sequelae of cerebral infarction: Secondary | ICD-10-CM | POA: Diagnosis not present

## 2021-01-03 DIAGNOSIS — E7849 Other hyperlipidemia: Secondary | ICD-10-CM | POA: Diagnosis not present

## 2021-01-03 DIAGNOSIS — D649 Anemia, unspecified: Secondary | ICD-10-CM | POA: Diagnosis not present

## 2021-01-03 DIAGNOSIS — R739 Hyperglycemia, unspecified: Secondary | ICD-10-CM | POA: Diagnosis not present

## 2021-01-10 DIAGNOSIS — I739 Peripheral vascular disease, unspecified: Secondary | ICD-10-CM | POA: Diagnosis not present

## 2021-01-10 DIAGNOSIS — I480 Paroxysmal atrial fibrillation: Secondary | ICD-10-CM | POA: Diagnosis not present

## 2021-01-10 DIAGNOSIS — Z0000 Encounter for general adult medical examination without abnormal findings: Secondary | ICD-10-CM | POA: Diagnosis not present

## 2021-01-10 DIAGNOSIS — I10 Essential (primary) hypertension: Secondary | ICD-10-CM | POA: Diagnosis not present

## 2021-01-10 DIAGNOSIS — Z1389 Encounter for screening for other disorder: Secondary | ICD-10-CM | POA: Diagnosis not present

## 2021-01-10 DIAGNOSIS — D6869 Other thrombophilia: Secondary | ICD-10-CM | POA: Diagnosis not present

## 2021-01-10 DIAGNOSIS — R739 Hyperglycemia, unspecified: Secondary | ICD-10-CM | POA: Diagnosis not present

## 2021-01-10 DIAGNOSIS — E785 Hyperlipidemia, unspecified: Secondary | ICD-10-CM | POA: Diagnosis not present

## 2021-01-12 ENCOUNTER — Encounter: Payer: Self-pay | Admitting: Urology

## 2021-01-12 ENCOUNTER — Other Ambulatory Visit: Payer: Self-pay

## 2021-01-12 ENCOUNTER — Ambulatory Visit: Payer: Medicare HMO | Admitting: Urology

## 2021-01-12 VITALS — BP 178/96 | HR 63 | Ht 72.0 in | Wt 192.9 lb

## 2021-01-12 DIAGNOSIS — N529 Male erectile dysfunction, unspecified: Secondary | ICD-10-CM

## 2021-01-12 DIAGNOSIS — N400 Enlarged prostate without lower urinary tract symptoms: Secondary | ICD-10-CM

## 2021-01-12 LAB — BLADDER SCAN AMB NON-IMAGING

## 2021-01-12 MED ORDER — TADALAFIL 20 MG PO TABS: 20.0000 mg | ORAL_TABLET | Freq: Every day | ORAL | 11 refills | Status: AC | PRN

## 2021-01-12 MED ORDER — FINASTERIDE 5 MG PO TABS: 5.0000 mg | ORAL_TABLET | Freq: Every day | ORAL | 3 refills | Status: DC

## 2021-01-12 NOTE — Progress Notes (Signed)
   01/12/2021 11:12 AM   NITIN MCKOWEN [birth year] [id number]  Reason for visit: Follow up BPH and ED   HPI: I saw Mr. Klang back in urology clinic today for follow-up of the above issues.  He is a 75 year old male who has mild urinary symptoms that are well controlled on finasteride, and have been stable over the last few years.  He really denies any significant urinary complaints today, and PVR is normal at 106 mL.    We elected to discontinue PSA screening previously secondary to his age and the AUA guidelines.  Last PSA was very low at 0.5 in October 2021, 1.0 corrected for finasteride  At our last visit, he was interested in a trial of medications for ED, and we prescribed Cialis 10 to 20 mg as needed.  His wife has continued to be sick with multiple hospitalizations, and he has not been sexually active over the last year.  We again reviewed the risks and benefits of this medication, and that it needs to be taken 1 to 2 hours prior to sexual activity.  Finasteride and Cialis refilled RTC 1 year symptom check and PVR  Billey Co, MD  [facility] [address] [place], [place] [address] [phone]

## 2021-01-20 ENCOUNTER — Other Ambulatory Visit: Payer: Self-pay | Admitting: Urology

## 2021-01-20 DIAGNOSIS — N400 Enlarged prostate without lower urinary tract symptoms: Secondary | ICD-10-CM

## 2021-02-28 DIAGNOSIS — I6930 Unspecified sequelae of cerebral infarction: Secondary | ICD-10-CM | POA: Diagnosis not present

## 2021-02-28 DIAGNOSIS — H2513 Age-related nuclear cataract, bilateral: Secondary | ICD-10-CM | POA: Diagnosis not present

## 2021-03-25 ENCOUNTER — Encounter: Payer: Self-pay | Admitting: Emergency Medicine

## 2021-03-25 ENCOUNTER — Other Ambulatory Visit: Payer: Self-pay

## 2021-03-25 DIAGNOSIS — R509 Fever, unspecified: Secondary | ICD-10-CM | POA: Diagnosis present

## 2021-03-25 DIAGNOSIS — U071 COVID-19: Secondary | ICD-10-CM | POA: Diagnosis not present

## 2021-03-25 LAB — RESP PANEL BY RT-PCR (FLU A&B, COVID) ARPGX2
Influenza A by PCR: NEGATIVE
Influenza B by PCR: NEGATIVE
SARS Coronavirus 2 by RT PCR: POSITIVE — AB

## 2021-03-25 NOTE — ED Triage Notes (Addendum)
Pt c/o fever and joint pain that started today. States temp was 102 at home x 1 hour ago, did not take tylenol. Temp 98.9 in triage. Pt's wife covid positive on 1/4.

## 2021-03-26 ENCOUNTER — Emergency Department
Admission: EM | Admit: 2021-03-26 | Discharge: 2021-03-26 | Disposition: A | Discharge status: Home / Self Care | Payer: Medicare HMO | Attending: Emergency Medicine | Admitting: Emergency Medicine

## 2021-03-26 DIAGNOSIS — U071 COVID-19: Secondary | ICD-10-CM

## 2021-03-26 MED ORDER — NIRMATRELVIR/RITONAVIR (PAXLOVID)TABLET: 3.0000 | ORAL_TABLET | Freq: Two times a day (BID) | ORAL | 0 refills | Status: AC

## 2021-03-26 NOTE — Discharge Instructions (Addendum)
You may take over-the-counter Tylenol 1000 mg every 6 hours as needed for pain and fever.

## 2021-03-26 NOTE — ED Provider Notes (Signed)
[facility] Provider Note    Event Date/Time   First MD Initiated Contact with Patient 03/26/21 [phone]     (approximate)   History   Fever   HPI  Kenneth Hawkins is a 76 y.o. male with history of CVA, hypertension, hyperlipidemia, A. fib on Eliquis who presents emergency department 1 day of fevers, cough, body aches.  Wife just tested positive for COVID-19.  Patient has had 3 COVID vaccines.  No chest pain or shortness of breath.  No vomiting or diarrhea.  No confusion per wife.   History provided by patient and wife.      Past Medical History:  Diagnosis Date   Anemia    BPH (benign prostatic hyperplasia)    BPH with obstruction/lower urinary tract symptoms    CVA (cerebral vascular accident) [facility])    ED (erectile dysfunction)    GERD (gastroesophageal reflux disease)    History of TIAs    Hyperglycemia    Hyperlipemia    Hypertension    Leg edema    Peripheral vascular disease ([place])    Prostatitis     Past Surgical History:  Procedure Laterality Date   COLONOSCOPY WITH ESOPHAGOGASTRODUODENOSCOPY (EGD)     COLONOSCOPY WITH PROPOFOL N/A 02/01/2015   Procedure: COLONOSCOPY WITH PROPOFOL;  Surgeon: Lollie Sails, MD;  Location[facility] ENDOSCOPY;  Service: Endoscopy;  Laterality: N/A;   COLONOSCOPY WITH PROPOFOL  04/25/2018   Procedure: COLONOSCOPY WITH PROPOFOL;  Surgeon: Lollie Sails, MD;  Location: [facility] ENDOSCOPY;  Service: Endoscopy;;   ESOPHAGOGASTRODUODENOSCOPY (EGD) WITH PROPOFOL N/A 04/25/2018   Procedure: ESOPHAGOGASTRODUODENOSCOPY (EGD) WITH PROPOFOL;  Surgeon: Lollie Sails, MD;  Location: [facility] ENDOSCOPY;  Service: Endoscopy;  Laterality: N/A;    MEDICATIONS:  Prior to Admission medications   Medication Sig Start Date End Date Taking? Authorizing Provider  amLODipine (NORVASC) 5 MG tablet Take by mouth. 09/25/16 09/25/17  [provider]  apixaban (ELIQUIS) 5 MG TABS tablet Take by mouth. 04/02/20   [provider]  aspirin EC 81 MG tablet Take by mouth.    [provider]  atorvastatin (LIPITOR) 80 MG tablet Take by mouth. 03/09/16 03/09/17  [provider]  ferrous sulfate 325 (65 FE) MG EC tablet Take 325 mg by mouth 3 (three) times daily with meals.    [provider]  finasteride (PROSCAR) 5 MG tablet TAKE 1 TABLET EVERY DAY 01/20/21   Billey Co, MD  furosemide (LASIX) 40 MG tablet Take by mouth. 07/05/16 07/05/17  [provider]  metoprolol tartrate (LOPRESSOR) 25 MG tablet Take by mouth. 08/22/16 08/22/17  [provider]  omeprazole (PRILOSEC) 20 MG capsule TAKE 1 CAPSULE EVERY DAY 09/18/14   [provider]  potassium chloride (KLOR-CON) 10 MEQ tablet  11/21/19   [provider]  tadalafil (CIALIS) 20 MG tablet Take 1 tablet (20 mg total) by mouth daily as needed for erectile dysfunction. 01/12/21   Billey Co, MD    Physical Exam   Triage Vital Signs: ED Triage Vitals  Enc Vitals Group     BP 03/25/21 2219 (!) 181/87     Pulse Rate 03/25/21 2219 95     Resp 03/25/21 2219 18     Temp 03/25/21 2219 98.9 F (37.2 C)     Temp Source 03/26/21 0226 Oral     SpO2 03/25/21 2219 99 %     Weight 03/25/21 2219 192 lb 14.4 oz (87.5 kg)  Height 03/25/21 2219 6' (1.829 m)     Head Circumference --      Peak Flow --      Pain Score 03/25/21 2219 8     Pain Loc --      Pain Edu? --      Excl. in [place]? --     Most recent vital signs: Vitals:   03/26/21 0356 03/26/21 0507  BP:  130/88  Pulse:  85  Resp:  18  Temp:    SpO2: 97% 98%    CONSTITUTIONAL: Alert and oriented and responds appropriately to questions. Well-appearing; well-nourished HEAD: Normocephalic, atraumatic EYES: Conjunctivae clear, pupils appear equal, sclera nonicteric ENT: normal nose; moist mucous membranes NECK: Supple, normal ROM CARD: RRR; S1 and S2 appreciated; no murmurs, no clicks, no rubs, no gallops RESP: Normal chest excursion  without splinting or tachypnea; breath sounds clear and equal bilaterally; no wheezes, no rhonchi, no rales, no hypoxia or respiratory distress, speaking full sentences ABD/GI: Normal bowel sounds; non-distended; soft, non-tender, no rebound, no guarding, no peritoneal signs BACK: The back appears normal EXT: Normal ROM in all joints; no deformity noted, no edema; no cyanosis SKIN: Normal color for age and race; warm; no rash on exposed skin NEURO: Moves all extremities equally, normal speech PSYCH: The patient's mood and manner are appropriate.   ED Results / Procedures / Treatments   LABS: (all labs ordered are listed, but only abnormal results are displayed) Labs Reviewed  RESP PANEL BY RT-PCR (FLU A&B, COVID) [id number] - Abnormal; Notable for the following components:      Result Value   SARS Coronavirus 2 by RT PCR POSITIVE (*)    All other components within normal limits     EKG:   RADIOLOGY: My personal review and interpretation of imaging:    I have personally reviewed all radiology reports.   No results found.   PROCEDURES:  Critical Care performed: No    Procedures    IMPRESSION / MDM / ASSESSMENT AND PLAN / ED COURSE  I reviewed the triage vital signs and the nursing notes.    Patient here with viral symptoms for the past day.  Wife tested positive for COVID-19 several days ago.    DIFFERENTIAL DIAGNOSIS (includes but not limited to):   COVID-19, influenza, other viral illness, pneumonia, less likely bacteremia, sepsis, UTI   PLAN: Plan to obtain COVID and flu swabs in the emergency department and obtain ambulatory sats.   MEDICATIONS GIVEN IN ED: Medications - No data to display   ED COURSE: Patient has been hemodynamically stable and was able to ambulate without respiratory distress or hypoxia.  No increased work of breathing.  He denies chest pain or shortness of breath.  His COVID test is positive.  Given he has multiple risk factors including  hypertension, hyperlipidemia, A. fib, previous CVA and his age, discussed that he meets criteria for paxlovid.  Recommended holding his atorvastatin for a week while on this medication.  Recommended over-the-counter Tylenol as needed for fever and pain.  At this time, I do not feel there is any life-threatening condition present. I reviewed all nursing notes, vitals, pertinent previous records.  All labs, EKGs, imaging ordered have been independently reviewed and interpreted by myself.  I have reviewed nursing notes and appropriate previous records.  I feel the patient is safe to be discharged home without further emergent workup and can continue workup as an outpatient as needed. Discussed all findings and treatment plan with patient  and wife as well as usual and customary return precautions.  They verbalize understanding and are comfortable with this plan.  Outpatient follow-up has been provided as needed. All questions have been answered.    CONSULTS: No admission required at this time given patient has no chest pain, shortness of breath, confusion, vomiting, hypoxia, increased work of breathing.   OUTSIDE RECORDS REVIEWED: Internal medicine notes with Dr. Caryl Comes at [facility] on 01/10/21, 07/08/20.  It appears during his last office visit his primary care doctor recommended the omicron COVID booster as well as shingles and pneumonia vaccines.         FINAL CLINICAL IMPRESSION(S) / ED DIAGNOSES   Final diagnoses:  [id number]     Rx / DC Orders   ED Discharge Orders          Ordered    nirmatrelvir/ritonavir EUA (PAXLOVID) 20 x 150 MG & 10 x 100MG  TABS  2 times daily        03/26/21 0459             Note:  This document was prepared using Dragon voice recognition software and may include unintentional dictation errors.   Taleen Prosser, Delice Bison, DO 03/26/21 [phone]

## 2021-05-09 DIAGNOSIS — H524 Presbyopia: Secondary | ICD-10-CM | POA: Diagnosis not present

## 2021-05-09 DIAGNOSIS — I69898 Other sequelae of other cerebrovascular disease: Secondary | ICD-10-CM | POA: Diagnosis not present

## 2021-05-09 DIAGNOSIS — H25013 Cortical age-related cataract, bilateral: Secondary | ICD-10-CM | POA: Diagnosis not present

## 2021-05-09 DIAGNOSIS — H353112 Nonexudative age-related macular degeneration, right eye, intermediate dry stage: Secondary | ICD-10-CM | POA: Diagnosis not present

## 2021-05-09 DIAGNOSIS — H2513 Age-related nuclear cataract, bilateral: Secondary | ICD-10-CM | POA: Diagnosis not present

## 2021-05-09 DIAGNOSIS — H2511 Age-related nuclear cataract, right eye: Secondary | ICD-10-CM | POA: Diagnosis not present

## 2021-05-09 DIAGNOSIS — H52213 Irregular astigmatism, bilateral: Secondary | ICD-10-CM | POA: Diagnosis not present

## 2021-05-11 IMAGING — CT CT HEAD CODE STROKE
3 series · 15 of 47 positions shown, 18 images · non-contrast
Comparison: Head CT 05/20/2015

CLINICAL DATA: Code stroke. Possible stroke, vision loss,
binocular.

EXAM:
CT HEAD WITHOUT CONTRAST
TECHNIQUE: Contiguous axial images were obtained from the base of the skull
through the vertex without intravenous contrast.

[Series 2: head wo · axial · 0.47mm/px · z∈[-108,+17]mm · 9 of 31 slices shown, 12 images]
[im 3/31  brain]
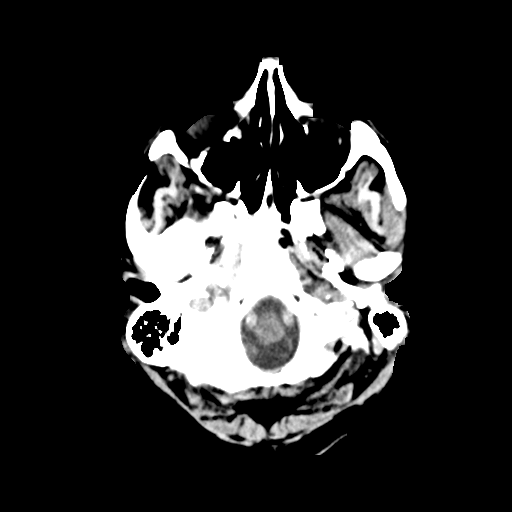
[im 3/31  bone]
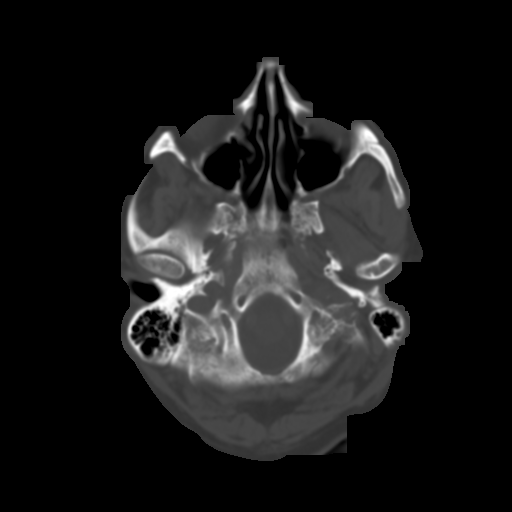
[im 6/31  brain]
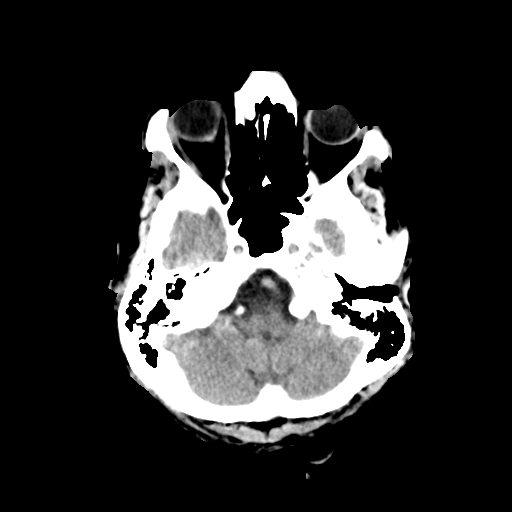
[im 9/31  brain]
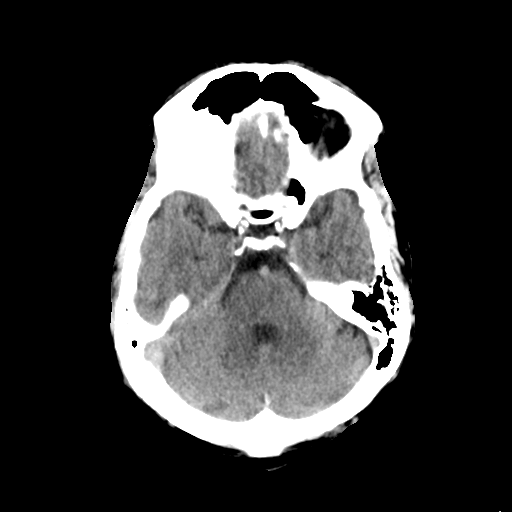
[im 12/31  brain]
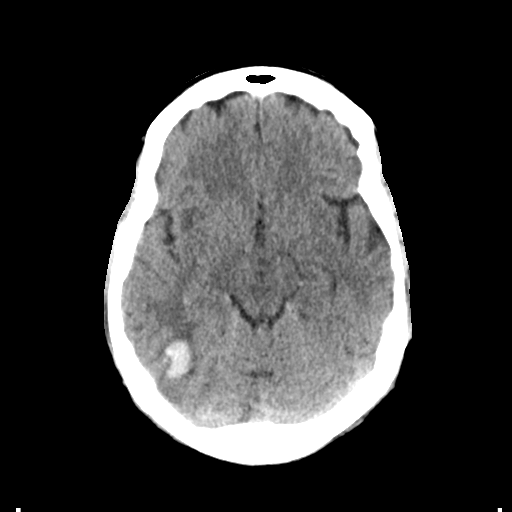
[im 16/31  brain]
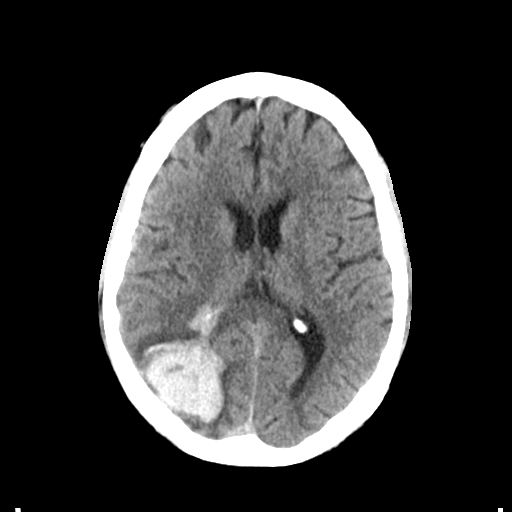
[im 16/31  bone]
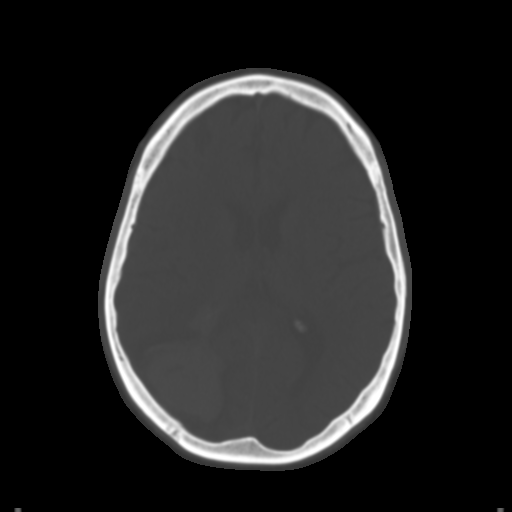
[im 19/31  brain]
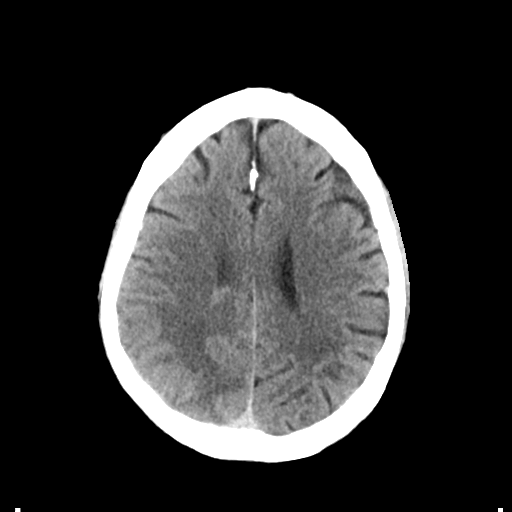
[im 22/31  brain]
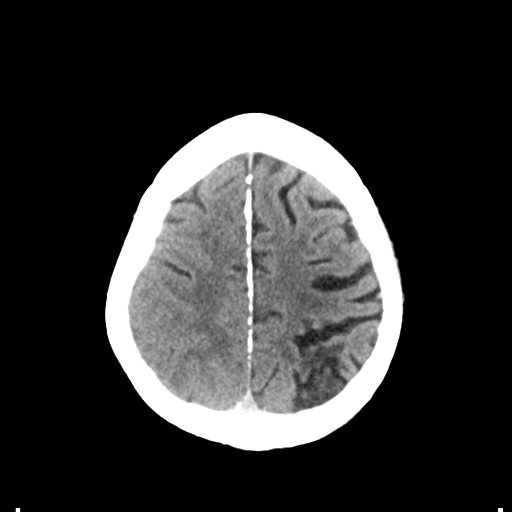
[im 25/31  brain]
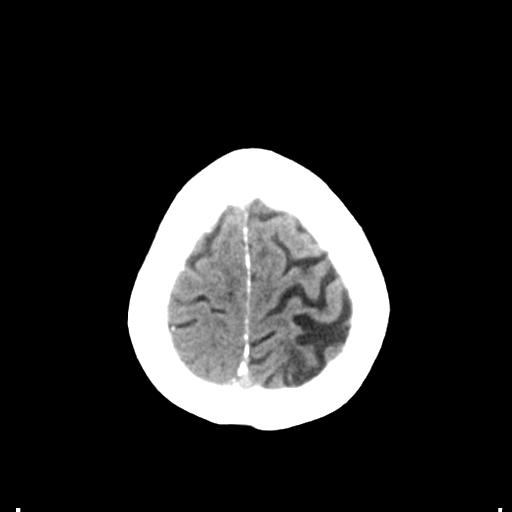
[im 28/31  brain]
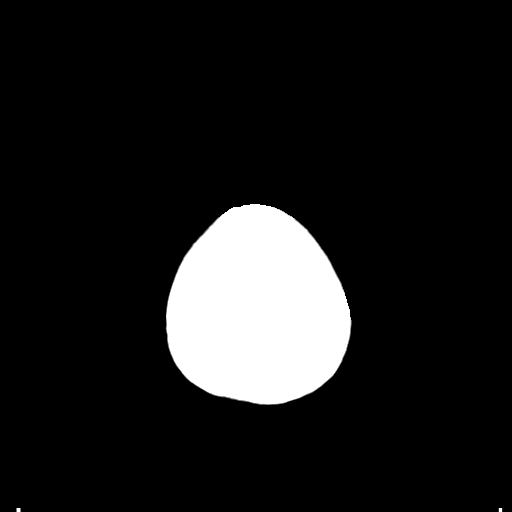
[im 28/31  bone]
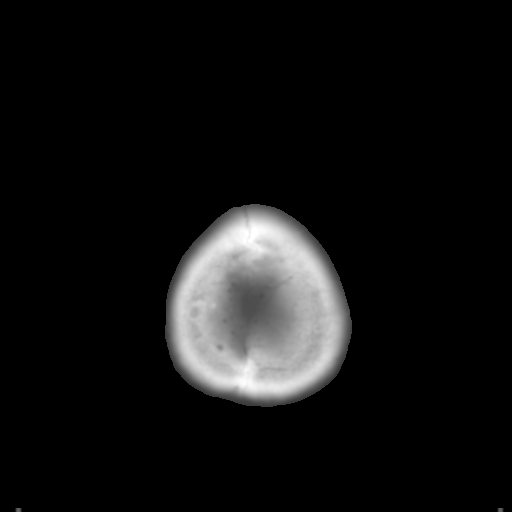

[Series 4: coronal soft tissue · coronal · 0.31mm/px · 3 of 64 slices shown]
[im 22/64  brain]
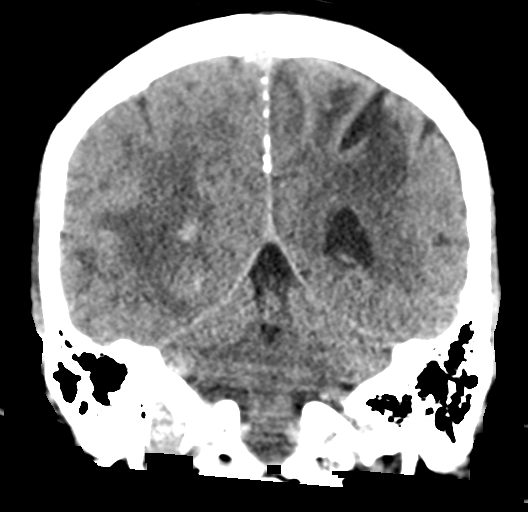
[im 29/64  brain]
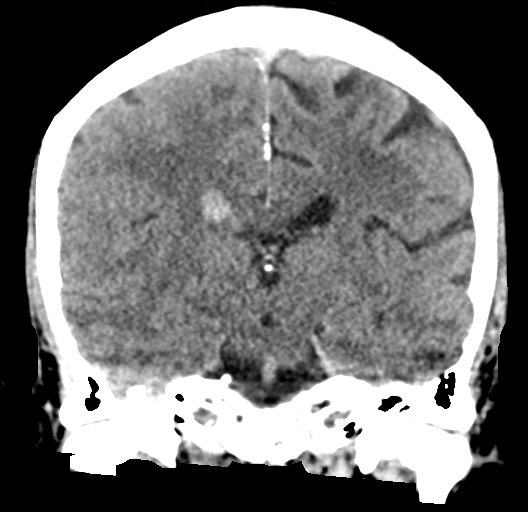
[im 36/64  brain]
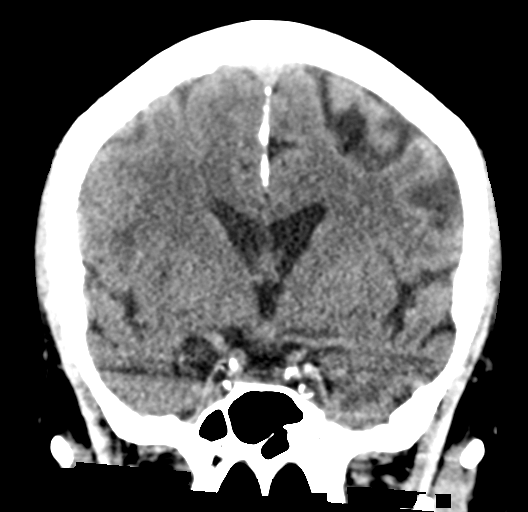

[Series 5: sagittal soft tissue · sagittal · 0.31mm/px · 3 of 55 slices shown]
[im 19/55  brain]
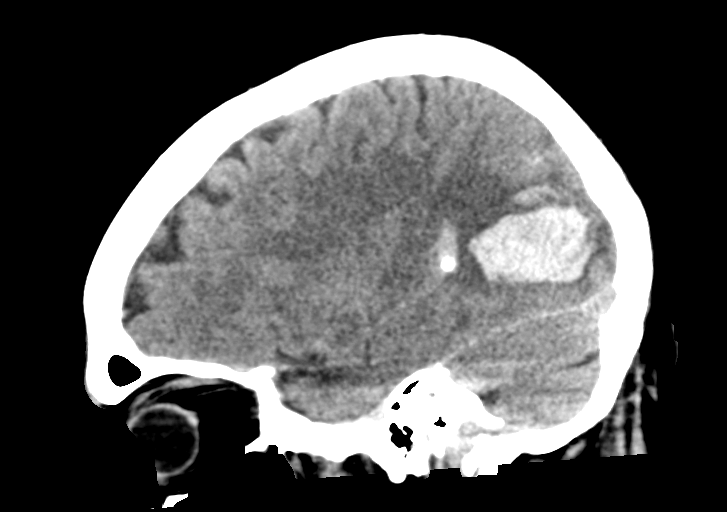
[im 28/55  brain]
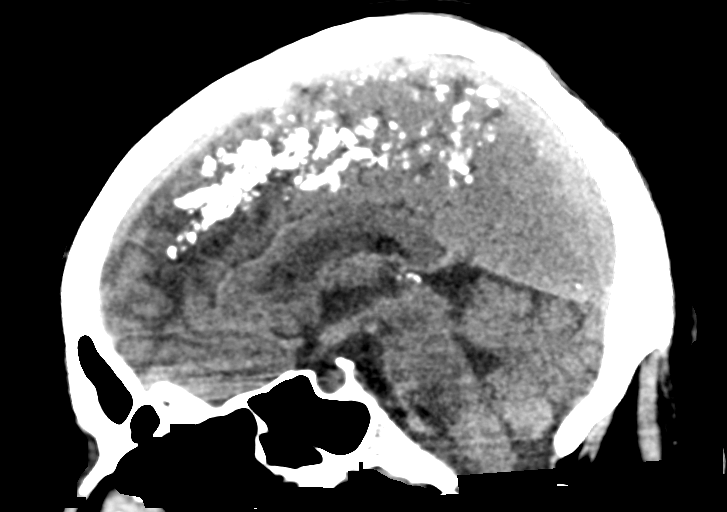
[im 37/55  brain]
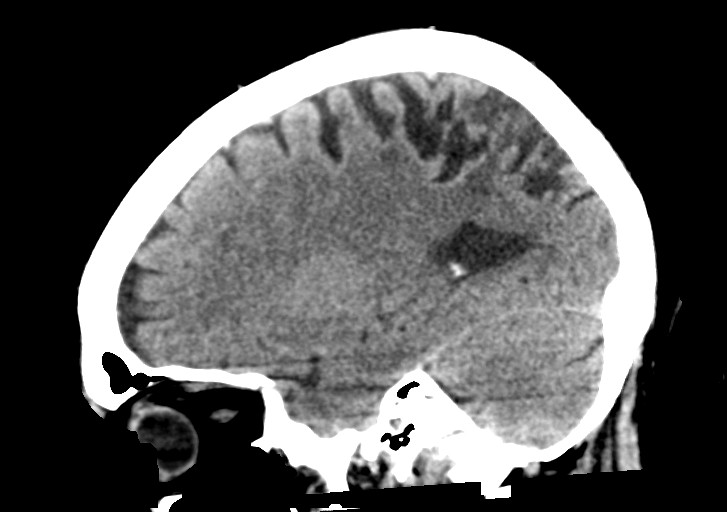

[15 of 47 positions shown; findings below may reference images not displayed]

FINDINGS: Brain:

There is a large acute parenchymal hemorrhage within the left
parietooccipital lobes measuring 4.8 x 4.0 x 3.7 cm. There is
moderate surrounding edema. There is mild extension into the
adjacent right lateral ventricle. There is partial effacement of the
posterior right lateral ventricle. No midline shift. No
hydrocephalus at this time. Redemonstrated chronic infarcts within
the left frontoparietal lobes. No extra-axial fluid collection.
Cerebral volume is normal for age.

Vascular: No hyperdense vessel.  Atherosclerotic calcifications.

Skull: Normal. Negative for fracture or focal lesion.

Sinuses/Orbits: Visualized orbits demonstrate no acute abnormality.
Mild mucosal thickening within anterior right ethmoid air cells. No
significant mastoid effusion.

These results were called by telephone at the time of interpretation
on 05/23/2019 at [DATE] to provider ELTON LUONG , who verbally
acknowledged these results.
IMPRESSION: 4.8 x 4.0 x 3.7 cm acute parenchymal hemorrhage centered within the
left parietooccipital lobes. There is mild extension into the
adjacent right lateral ventricle. There is effacement of the
posterior right lateral ventricle. No midline shift or evidence of
hydrocephalus on the current exam. Contrast-enhanced brain MRI is
recommended as the hematoma involutes to assess for an underlying
lesion.

Redemonstrated chronic infarcts within the left cerebral hemisphere.

## 2021-05-31 DIAGNOSIS — H25811 Combined forms of age-related cataract, right eye: Secondary | ICD-10-CM | POA: Diagnosis not present

## 2021-05-31 DIAGNOSIS — H2511 Age-related nuclear cataract, right eye: Secondary | ICD-10-CM | POA: Diagnosis not present

## 2021-06-09 DIAGNOSIS — H2511 Age-related nuclear cataract, right eye: Secondary | ICD-10-CM | POA: Diagnosis not present

## 2021-06-27 DIAGNOSIS — H2512 Age-related nuclear cataract, left eye: Secondary | ICD-10-CM | POA: Diagnosis not present

## 2021-06-27 DIAGNOSIS — H25012 Cortical age-related cataract, left eye: Secondary | ICD-10-CM | POA: Diagnosis not present

## 2021-07-04 DIAGNOSIS — D6869 Other thrombophilia: Secondary | ICD-10-CM | POA: Diagnosis not present

## 2021-07-04 DIAGNOSIS — K219 Gastro-esophageal reflux disease without esophagitis: Secondary | ICD-10-CM | POA: Diagnosis not present

## 2021-07-04 DIAGNOSIS — E7849 Other hyperlipidemia: Secondary | ICD-10-CM | POA: Diagnosis not present

## 2021-07-04 DIAGNOSIS — I480 Paroxysmal atrial fibrillation: Secondary | ICD-10-CM | POA: Diagnosis not present

## 2021-07-04 DIAGNOSIS — I739 Peripheral vascular disease, unspecified: Secondary | ICD-10-CM | POA: Diagnosis not present

## 2021-07-04 DIAGNOSIS — R739 Hyperglycemia, unspecified: Secondary | ICD-10-CM | POA: Diagnosis not present

## 2021-07-05 DIAGNOSIS — H25012 Cortical age-related cataract, left eye: Secondary | ICD-10-CM | POA: Diagnosis not present

## 2021-07-05 DIAGNOSIS — H2512 Age-related nuclear cataract, left eye: Secondary | ICD-10-CM | POA: Diagnosis not present

## 2021-07-05 DIAGNOSIS — H25812 Combined forms of age-related cataract, left eye: Secondary | ICD-10-CM | POA: Diagnosis not present

## 2021-07-12 DIAGNOSIS — I480 Paroxysmal atrial fibrillation: Secondary | ICD-10-CM | POA: Diagnosis not present

## 2021-07-12 DIAGNOSIS — Z8679 Personal history of other diseases of the circulatory system: Secondary | ICD-10-CM | POA: Diagnosis not present

## 2021-07-12 DIAGNOSIS — D6869 Other thrombophilia: Secondary | ICD-10-CM | POA: Diagnosis not present

## 2021-07-12 DIAGNOSIS — E785 Hyperlipidemia, unspecified: Secondary | ICD-10-CM | POA: Diagnosis not present

## 2021-07-12 DIAGNOSIS — I739 Peripheral vascular disease, unspecified: Secondary | ICD-10-CM | POA: Diagnosis not present

## 2021-07-12 DIAGNOSIS — D649 Anemia, unspecified: Secondary | ICD-10-CM | POA: Diagnosis not present

## 2021-07-12 DIAGNOSIS — R739 Hyperglycemia, unspecified: Secondary | ICD-10-CM | POA: Diagnosis not present

## 2021-07-12 DIAGNOSIS — I10 Essential (primary) hypertension: Secondary | ICD-10-CM | POA: Diagnosis not present

## 2021-07-13 DIAGNOSIS — H2512 Age-related nuclear cataract, left eye: Secondary | ICD-10-CM | POA: Diagnosis not present

## 2021-07-27 DIAGNOSIS — Z0100 Encounter for examination of eyes and vision without abnormal findings: Secondary | ICD-10-CM | POA: Diagnosis not present

## 2021-12-26 ENCOUNTER — Other Ambulatory Visit: Payer: Self-pay

## 2021-12-26 ENCOUNTER — Emergency Department
Admission: EM | Admit: 2021-12-26 | Discharge: 2021-12-26 | Disposition: A | Discharge status: Home / Self Care | Payer: Medicare HMO | Attending: Emergency Medicine | Admitting: Emergency Medicine

## 2021-12-26 ENCOUNTER — Emergency Department: Payer: Medicare HMO

## 2021-12-26 DIAGNOSIS — S3992XA Unspecified injury of lower back, initial encounter: Secondary | ICD-10-CM | POA: Diagnosis present

## 2021-12-26 DIAGNOSIS — X500XXA Overexertion from strenuous movement or load, initial encounter: Secondary | ICD-10-CM | POA: Diagnosis not present

## 2021-12-26 DIAGNOSIS — S46911A Strain of unspecified muscle, fascia and tendon at shoulder and upper arm level, right arm, initial encounter: Secondary | ICD-10-CM | POA: Diagnosis not present

## 2021-12-26 DIAGNOSIS — Z7901 Long term (current) use of anticoagulants: Secondary | ICD-10-CM | POA: Insufficient documentation

## 2021-12-26 DIAGNOSIS — R001 Bradycardia, unspecified: Secondary | ICD-10-CM | POA: Diagnosis not present

## 2021-12-26 DIAGNOSIS — I4891 Unspecified atrial fibrillation: Secondary | ICD-10-CM | POA: Insufficient documentation

## 2021-12-26 DIAGNOSIS — T148XXA Other injury of unspecified body region, initial encounter: Secondary | ICD-10-CM

## 2021-12-26 DIAGNOSIS — Z79899 Other long term (current) drug therapy: Secondary | ICD-10-CM | POA: Diagnosis not present

## 2021-12-26 DIAGNOSIS — R079 Chest pain, unspecified: Secondary | ICD-10-CM | POA: Diagnosis not present

## 2021-12-26 DIAGNOSIS — I10 Essential (primary) hypertension: Secondary | ICD-10-CM | POA: Diagnosis not present

## 2021-12-26 DIAGNOSIS — S39012A Strain of muscle, fascia and tendon of lower back, initial encounter: Secondary | ICD-10-CM | POA: Diagnosis not present

## 2021-12-26 DIAGNOSIS — M25511 Pain in right shoulder: Secondary | ICD-10-CM | POA: Diagnosis not present

## 2021-12-26 LAB — CBC WITH DIFFERENTIAL/PLATELET
Abs Immature Granulocytes: 0.04 10*3/uL (ref 0.00–0.07)
Basophils Absolute: 0 10*3/uL (ref 0.0–0.1)
Basophils Relative: 0 %
Eosinophils Absolute: 0.1 10*3/uL (ref 0.0–0.5)
Eosinophils Relative: 1 %
HCT: 41.7 % (ref 39.0–52.0)
Hemoglobin: 13 g/dL (ref 13.0–17.0)
Immature Granulocytes: 0 %
Lymphocytes Relative: 16 %
Lymphs Abs: 1.7 10*3/uL (ref 0.7–4.0)
MCH: 26.3 pg (ref 26.0–34.0)
MCHC: 31.2 g/dL (ref 30.0–36.0)
MCV: 84.4 fL (ref 80.0–100.0)
Monocytes Absolute: 0.7 10*3/uL (ref 0.1–1.0)
Monocytes Relative: 7 %
Neutro Abs: 8.2 10*3/uL — ABNORMAL HIGH (ref 1.7–7.7)
Neutrophils Relative %: 76 %
Platelets: 216 10*3/uL (ref 150–400)
RBC: 4.94 MIL/uL (ref 4.22–5.81)
RDW: 15.2 % (ref 11.5–15.5)
WBC: 10.8 10*3/uL — ABNORMAL HIGH (ref 4.0–10.5)
nRBC: 0 % (ref 0.0–0.2)

## 2021-12-26 LAB — COMPREHENSIVE METABOLIC PANEL
ALT: 21 U/L (ref 0–44)
AST: 21 U/L (ref 15–41)
Albumin: 4.1 g/dL (ref 3.5–5.0)
Alkaline Phosphatase: 61 U/L (ref 38–126)
Anion gap: 5 (ref 5–15)
BUN: 12 mg/dL (ref 8–23)
CO2: 27 mmol/L (ref 22–32)
Calcium: 9.2 mg/dL (ref 8.9–10.3)
Chloride: 110 mmol/L (ref 98–111)
Creatinine, Ser: 0.99 mg/dL (ref 0.61–1.24)
GFR, Estimated: >60 mL/min (ref >60)
Glucose, Bld: 117 mg/dL — ABNORMAL HIGH (ref 70–99)
Potassium: 4.3 mmol/L (ref 3.5–5.1)
Sodium: 142 mmol/L (ref 135–145)
Total Bilirubin: 0.9 mg/dL (ref 0.3–1.2)
Total Protein: 7.4 g/dL (ref 6.5–8.1)

## 2021-12-26 LAB — TROPONIN I (HIGH SENSITIVITY): Troponin I (High Sensitivity): 4 ng/L (ref <18)

## 2021-12-26 MED ORDER — ACETAMINOPHEN 500 MG PO TABS
1000.0000 mg | ORAL_TABLET | Freq: Once | ORAL | Status: AC
  Administered 2021-12-26: 1000 mg via ORAL
  Filled 2021-12-26: qty 2

## 2021-12-26 MED ORDER — LIDOCAINE 5 % EX PTCH: 1.0000 | MEDICATED_PATCH | Freq: Two times a day (BID) | CUTANEOUS | 0 refills | Status: AC

## 2021-12-26 MED ORDER — LIDOCAINE 5 % EX PTCH
1.0000 | MEDICATED_PATCH | Freq: Once | CUTANEOUS | Status: DC
  Administered 2021-12-26: 1 via TRANSDERMAL
  Filled 2021-12-26: qty 1

## 2021-12-26 NOTE — ED Provider Notes (Addendum)
[facility] Provider Note    Event Date/Time   First MD Initiated Contact with Patient 12/26/21 1036     (approximate)   History   Back Pain   HPI  Kenneth Hawkins is a 76 y.o. male with history of intraparenchymal hemorrhage who comes in with concerns for right arm pain.  Patient reports that he also has a history of A-fib which she is on Eliquis for also reports a history of hypertension.  He denies any headaches, shortness of breath, leg swelling.  He reports having pain in his upper shoulder going down into his arm.  He denies any pain at rest but only pain when he tries to move his arm.  Denies any weakness or numbness associated with it.  He reports taking some naproxen without any relief in symptoms.  Reports his pain is actually much better today than it was yesterday but he will need to have it evaluated to make sure there is nothing else more serious going on given his going on for a week.  He does report doing a lot of heavy lifting including picking a large man up off the ground a few days ago as well as doing a lot of yard work with taking down trees and this was just before the pain began so he thought it could be related to that.   Physical Exam   Triage Vital Signs: ED Triage Vitals  Enc Vitals Group     BP 12/26/21 0901 (!) 173/84     Pulse Rate 12/26/21 0901 (!) 59     Resp 12/26/21 0901 18     Temp --      Temp src --      SpO2 12/26/21 0901 98 %     Weight 12/26/21 0901 190 lb (86.2 kg)     Height 12/26/21 0901 6' (1.829 m)     Head Circumference --      Peak Flow --      Pain Score 12/26/21 0844 10     Pain Loc --      Pain Edu? --      Excl. in [place]? --     Most recent vital signs: Vitals:   12/26/21 0901  BP: (!) 173/84  Pulse: (!) 59  Resp: 18  SpO2: 98%     General: Awake, no distress.  CV:  Good peripheral perfusion.  Resp:  Normal effort.  Abd:  No distention.  Other:  No swelling on the joint.  Good distal pulse.   No warmth or erythema.  No swelling of the arm.  He reports no pain at rest but then when he tries to lift his arm up he reports pain.  He has no CTL spine tenderness.  He has full range of motion of head.  Cranial nerves are intact.   ED Results / Procedures / Treatments   Labs (all labs ordered are listed, but only abnormal results are displayed) Labs Reviewed  CBC WITH DIFFERENTIAL/PLATELET - Abnormal; Notable for the following components:      Result Value   WBC 10.8 (*)    Neutro Abs 8.2 (*)    All other components within normal limits  COMPREHENSIVE METABOLIC PANEL - Abnormal; Notable for the following components:   Glucose, Bld 117 (*)    All other components within normal limits  TROPONIN I (HIGH SENSITIVITY)     EKG  My interpretation of EKG:  Sinus bradycardia rate of 54 without  any ST elevation or T wave versions, normal intervals  EKG is sinus bradycardia rate of 51 without any ST elevation or T wave inversions, normal intervals  RADIOLOGY I have reviewed the xray personally and interpreted no evidence of any fracture  PROCEDURES:  Critical Care performed: No  Procedures   MEDICATIONS ORDERED IN ED: Medications  lidocaine (LIDODERM) 5 % 1 patch (1 patch Transdermal Patch Applied 12/26/21 1105)  acetaminophen (TYLENOL) tablet 1,000 mg (1,000 mg Oral Given 12/26/21 1104)     IMPRESSION / MDM / ASSESSMENT AND PLAN / ED COURSE  I reviewed the triage vital signs and the nursing notes.   Patient's presentation is most consistent with acute presentation with potential threat to life or bodily function.   Patient comes in with right shoulder pain that sounds more likely musculoskeletal.  He denies any pain at rest but only pain when he moves it.  Cranial nerves are intact without any headache therefore a low suspicion for intercranial hemorrhage.  We discussed that his blood pressure was slightly elevated today and he stated that it is usually elevated when he  comes into the emergency room but he will have it rechecked with his primary care doctor and continue to take his blood pressure medicine.  Labs ordered to make sure no evidence of ACS just given patient's age.  Patient's troponin was negative.  CBC showed slight elevated white count but there is no signs of infection on the arm.  CMP was reassuring.  Patient reports feeling much better with the Tylenol and lidocaine patch.  He is able to range his joint without any significant pain but is going to follow-up with orthopedics return to the ER if he develops worsening symptoms such as headaches confusion numbness or weakness of the arms  Temperature done by tech was normal.  Consider  admission given the patient's age but given this seems all musculoskeletal in nature patient feels comfortable discharge     FINAL CLINICAL IMPRESSION(S) / ED DIAGNOSES   Final diagnoses:  Muscle strain     Rx / DC Orders   ED Discharge Orders          Ordered    lidocaine (LIDODERM) 5 %  Every 12 hours        12/26/21 1254             Note:  This document was prepared using Dragon voice recognition software and may include unintentional dictation errors.   Vanessa Rathbun, MD 12/26/21 1255    Vanessa Silex, MD 12/26/21 1255    Vanessa Victoria, MD 12/26/21 1318

## 2021-12-26 NOTE — Discharge Instructions (Addendum)
Take Tylenol 1 g every 8 hours.  Use the lidocaine patches.  You can follow-up with orthopedics in case there could be a ligament injury return to the ER if you develop worsening symptoms or any other concerns  Call your primary care doctor and have your blood pressure rechecked in 2 days given your history is important that this gets better controlled if it is remaining elevated.

## 2021-12-26 NOTE — ED Triage Notes (Signed)
Pt comes with c/o right upper back and arm pain for over week. Pt thinks he might have pulled muscle.   Pt denies any CP.

## 2021-12-30 DIAGNOSIS — D6869 Other thrombophilia: Secondary | ICD-10-CM | POA: Diagnosis not present

## 2021-12-30 DIAGNOSIS — R600 Localized edema: Secondary | ICD-10-CM | POA: Diagnosis not present

## 2021-12-30 DIAGNOSIS — R7303 Prediabetes: Secondary | ICD-10-CM | POA: Diagnosis not present

## 2021-12-30 DIAGNOSIS — I480 Paroxysmal atrial fibrillation: Secondary | ICD-10-CM | POA: Diagnosis not present

## 2021-12-30 DIAGNOSIS — E785 Hyperlipidemia, unspecified: Secondary | ICD-10-CM | POA: Diagnosis not present

## 2021-12-30 DIAGNOSIS — M79601 Pain in right arm: Secondary | ICD-10-CM | POA: Diagnosis not present

## 2021-12-30 DIAGNOSIS — Z8679 Personal history of other diseases of the circulatory system: Secondary | ICD-10-CM | POA: Diagnosis not present

## 2021-12-30 DIAGNOSIS — I739 Peripheral vascular disease, unspecified: Secondary | ICD-10-CM | POA: Diagnosis not present

## 2021-12-30 DIAGNOSIS — I10 Essential (primary) hypertension: Secondary | ICD-10-CM | POA: Diagnosis not present

## 2022-01-09 DIAGNOSIS — D649 Anemia, unspecified: Secondary | ICD-10-CM | POA: Diagnosis not present

## 2022-01-09 DIAGNOSIS — E7849 Other hyperlipidemia: Secondary | ICD-10-CM | POA: Diagnosis not present

## 2022-01-09 DIAGNOSIS — R739 Hyperglycemia, unspecified: Secondary | ICD-10-CM | POA: Diagnosis not present

## 2022-01-12 ENCOUNTER — Ambulatory Visit: Payer: Medicare HMO | Admitting: Urology

## 2022-01-12 VITALS — BP 173/70 | HR 67 | Ht 72.0 in | Wt 189.0 lb

## 2022-01-12 DIAGNOSIS — N400 Enlarged prostate without lower urinary tract symptoms: Secondary | ICD-10-CM

## 2022-01-12 DIAGNOSIS — N529 Male erectile dysfunction, unspecified: Secondary | ICD-10-CM | POA: Diagnosis not present

## 2022-01-12 LAB — BLADDER SCAN AMB NON-IMAGING: Scan Result: 155

## 2022-01-12 MED ORDER — FINASTERIDE 5 MG PO TABS: 5.0000 mg | ORAL_TABLET | Freq: Every day | ORAL | 3 refills | Status: DC

## 2022-01-12 NOTE — Progress Notes (Signed)
   01/12/2022 10:57 AM   DEANTRE BOURDON 02-06-1946 [id number]  Reason for visit: Follow up BPH and ED   HPI: I saw Mr. Diel back in urology clinic today for follow-up of the above issues.  He is a 76 year old male who has mild urinary symptoms that are well controlled on finasteride, and have been stable over the last few years.  He really denies any significant urinary complaints today, and PVR is normal at 150 mL.  He wonders if he can come off the finasteride.  I think it is reasonable to stop the finasteride and see if urinary symptoms return, and risk and benefits were discussed.  We elected to discontinue PSA screening previously secondary to his age and the AUA guidelines.  Last PSA was very low at 0.5 in October 2021, 1.0 corrected for finasteride  Previously had Cialis 10 to 20 mg on demand for ED, but his wife has had persistent health problems over the last few years and he has never used that medication.  Not interested in further treatment for ED at this time.  Finasteride refilled, okay to trial off finasteride and see how urinary symptoms respond if patient desires RTC 1 year symptom check and PVR  Billey Co, MD  [facility] [address], [place], [place] [address] [phone]

## 2022-01-16 DIAGNOSIS — Z8679 Personal history of other diseases of the circulatory system: Secondary | ICD-10-CM | POA: Diagnosis not present

## 2022-01-16 DIAGNOSIS — I10 Essential (primary) hypertension: Secondary | ICD-10-CM | POA: Diagnosis not present

## 2022-01-16 DIAGNOSIS — R7303 Prediabetes: Secondary | ICD-10-CM | POA: Diagnosis not present

## 2022-01-16 DIAGNOSIS — Z0000 Encounter for general adult medical examination without abnormal findings: Secondary | ICD-10-CM | POA: Diagnosis not present

## 2022-01-16 DIAGNOSIS — Z1331 Encounter for screening for depression: Secondary | ICD-10-CM | POA: Diagnosis not present

## 2022-01-16 DIAGNOSIS — E785 Hyperlipidemia, unspecified: Secondary | ICD-10-CM | POA: Diagnosis not present

## 2022-01-16 DIAGNOSIS — I480 Paroxysmal atrial fibrillation: Secondary | ICD-10-CM | POA: Diagnosis not present

## 2022-01-16 DIAGNOSIS — D6869 Other thrombophilia: Secondary | ICD-10-CM | POA: Diagnosis not present

## 2022-01-16 DIAGNOSIS — I739 Peripheral vascular disease, unspecified: Secondary | ICD-10-CM | POA: Diagnosis not present

## 2022-01-17 ENCOUNTER — Other Ambulatory Visit: Payer: Self-pay | Admitting: Internal Medicine

## 2022-01-17 DIAGNOSIS — M542 Cervicalgia: Secondary | ICD-10-CM

## 2022-01-18 DIAGNOSIS — H903 Sensorineural hearing loss, bilateral: Secondary | ICD-10-CM | POA: Diagnosis not present

## 2022-01-24 ENCOUNTER — Ambulatory Visit
Admission: RE | Admit: 2022-01-24 | Discharge: 2022-01-24 | Disposition: A | Discharge status: Home / Self Care | Payer: Medicare HMO | Source: Ambulatory Visit | Attending: Internal Medicine | Admitting: Internal Medicine

## 2022-01-24 DIAGNOSIS — M542 Cervicalgia: Secondary | ICD-10-CM | POA: Insufficient documentation

## 2022-01-24 DIAGNOSIS — R200 Anesthesia of skin: Secondary | ICD-10-CM | POA: Diagnosis not present

## 2022-02-03 DIAGNOSIS — M5412 Radiculopathy, cervical region: Secondary | ICD-10-CM | POA: Diagnosis not present

## 2022-02-16 DIAGNOSIS — M5412 Radiculopathy, cervical region: Secondary | ICD-10-CM | POA: Diagnosis not present

## 2022-02-27 ENCOUNTER — Other Ambulatory Visit: Payer: Self-pay | Admitting: Urology

## 2022-02-27 DIAGNOSIS — N400 Enlarged prostate without lower urinary tract symptoms: Secondary | ICD-10-CM

## 2022-03-09 DIAGNOSIS — D6869 Other thrombophilia: Secondary | ICD-10-CM | POA: Diagnosis not present

## 2022-03-09 DIAGNOSIS — I739 Peripheral vascular disease, unspecified: Secondary | ICD-10-CM | POA: Diagnosis not present

## 2022-03-09 DIAGNOSIS — E7849 Other hyperlipidemia: Secondary | ICD-10-CM | POA: Diagnosis not present

## 2022-03-09 DIAGNOSIS — Z8673 Personal history of transient ischemic attack (TIA), and cerebral infarction without residual deficits: Secondary | ICD-10-CM | POA: Diagnosis not present

## 2022-03-09 DIAGNOSIS — I6523 Occlusion and stenosis of bilateral carotid arteries: Secondary | ICD-10-CM | POA: Diagnosis not present

## 2022-03-09 DIAGNOSIS — M5412 Radiculopathy, cervical region: Secondary | ICD-10-CM | POA: Diagnosis not present

## 2022-03-09 DIAGNOSIS — I10 Essential (primary) hypertension: Secondary | ICD-10-CM | POA: Diagnosis not present

## 2022-03-09 DIAGNOSIS — R7303 Prediabetes: Secondary | ICD-10-CM | POA: Diagnosis not present

## 2022-03-09 DIAGNOSIS — I480 Paroxysmal atrial fibrillation: Secondary | ICD-10-CM | POA: Diagnosis not present

## 2022-03-09 DIAGNOSIS — Z8679 Personal history of other diseases of the circulatory system: Secondary | ICD-10-CM | POA: Diagnosis not present

## 2022-03-09 DIAGNOSIS — Z01818 Encounter for other preprocedural examination: Secondary | ICD-10-CM | POA: Diagnosis not present

## 2022-03-21 DIAGNOSIS — H26493 Other secondary cataract, bilateral: Secondary | ICD-10-CM | POA: Diagnosis not present

## 2022-03-21 DIAGNOSIS — I6930 Unspecified sequelae of cerebral infarction: Secondary | ICD-10-CM | POA: Diagnosis not present

## 2022-03-27 DIAGNOSIS — K219 Gastro-esophageal reflux disease without esophagitis: Secondary | ICD-10-CM | POA: Diagnosis not present

## 2022-03-27 DIAGNOSIS — R791 Abnormal coagulation profile: Secondary | ICD-10-CM | POA: Diagnosis not present

## 2022-03-27 DIAGNOSIS — I480 Paroxysmal atrial fibrillation: Secondary | ICD-10-CM | POA: Diagnosis not present

## 2022-03-27 DIAGNOSIS — I10 Essential (primary) hypertension: Secondary | ICD-10-CM | POA: Diagnosis not present

## 2022-03-27 DIAGNOSIS — Z8673 Personal history of transient ischemic attack (TIA), and cerebral infarction without residual deficits: Secondary | ICD-10-CM | POA: Diagnosis not present

## 2022-03-27 DIAGNOSIS — R7303 Prediabetes: Secondary | ICD-10-CM | POA: Diagnosis not present

## 2022-03-27 DIAGNOSIS — E785 Hyperlipidemia, unspecified: Secondary | ICD-10-CM | POA: Diagnosis not present

## 2022-03-27 DIAGNOSIS — Z79899 Other long term (current) drug therapy: Secondary | ICD-10-CM | POA: Diagnosis not present

## 2022-03-27 DIAGNOSIS — M5412 Radiculopathy, cervical region: Secondary | ICD-10-CM | POA: Diagnosis not present

## 2022-03-27 DIAGNOSIS — M4802 Spinal stenosis, cervical region: Secondary | ICD-10-CM | POA: Diagnosis not present

## 2022-03-27 DIAGNOSIS — Z886 Allergy status to analgesic agent status: Secondary | ICD-10-CM | POA: Diagnosis not present

## 2022-03-27 DIAGNOSIS — M4803 Spinal stenosis, cervicothoracic region: Secondary | ICD-10-CM | POA: Diagnosis not present

## 2022-03-28 DIAGNOSIS — Z886 Allergy status to analgesic agent status: Secondary | ICD-10-CM | POA: Diagnosis not present

## 2022-03-28 DIAGNOSIS — K219 Gastro-esophageal reflux disease without esophagitis: Secondary | ICD-10-CM | POA: Diagnosis not present

## 2022-03-28 DIAGNOSIS — E785 Hyperlipidemia, unspecified: Secondary | ICD-10-CM | POA: Diagnosis not present

## 2022-03-28 DIAGNOSIS — M4802 Spinal stenosis, cervical region: Secondary | ICD-10-CM | POA: Diagnosis not present

## 2022-03-28 DIAGNOSIS — I480 Paroxysmal atrial fibrillation: Secondary | ICD-10-CM | POA: Diagnosis not present

## 2022-03-28 DIAGNOSIS — R791 Abnormal coagulation profile: Secondary | ICD-10-CM | POA: Diagnosis not present

## 2022-03-28 DIAGNOSIS — M5412 Radiculopathy, cervical region: Secondary | ICD-10-CM | POA: Diagnosis not present

## 2022-03-28 DIAGNOSIS — I6523 Occlusion and stenosis of bilateral carotid arteries: Secondary | ICD-10-CM | POA: Diagnosis not present

## 2022-03-28 DIAGNOSIS — Z79899 Other long term (current) drug therapy: Secondary | ICD-10-CM | POA: Diagnosis not present

## 2022-03-28 DIAGNOSIS — R7303 Prediabetes: Secondary | ICD-10-CM | POA: Diagnosis not present

## 2022-03-28 DIAGNOSIS — I10 Essential (primary) hypertension: Secondary | ICD-10-CM | POA: Diagnosis not present

## 2022-03-28 DIAGNOSIS — Z8673 Personal history of transient ischemic attack (TIA), and cerebral infarction without residual deficits: Secondary | ICD-10-CM | POA: Diagnosis not present

## 2022-03-28 DIAGNOSIS — M5010 Cervical disc disorder with radiculopathy, unspecified cervical region: Secondary | ICD-10-CM | POA: Diagnosis not present

## 2022-03-31 DIAGNOSIS — I10 Essential (primary) hypertension: Secondary | ICD-10-CM | POA: Diagnosis not present

## 2022-03-31 DIAGNOSIS — Z8679 Personal history of other diseases of the circulatory system: Secondary | ICD-10-CM | POA: Diagnosis not present

## 2022-03-31 DIAGNOSIS — R7303 Prediabetes: Secondary | ICD-10-CM | POA: Diagnosis not present

## 2022-03-31 DIAGNOSIS — I480 Paroxysmal atrial fibrillation: Secondary | ICD-10-CM | POA: Diagnosis not present

## 2022-03-31 DIAGNOSIS — I739 Peripheral vascular disease, unspecified: Secondary | ICD-10-CM | POA: Diagnosis not present

## 2022-03-31 DIAGNOSIS — D6869 Other thrombophilia: Secondary | ICD-10-CM | POA: Diagnosis not present

## 2022-04-11 DIAGNOSIS — M5412 Radiculopathy, cervical region: Secondary | ICD-10-CM | POA: Diagnosis not present

## 2022-04-11 DIAGNOSIS — Z4789 Encounter for other orthopedic aftercare: Secondary | ICD-10-CM | POA: Diagnosis not present

## 2022-04-11 DIAGNOSIS — Z87891 Personal history of nicotine dependence: Secondary | ICD-10-CM | POA: Diagnosis not present

## 2022-05-09 DIAGNOSIS — M5412 Radiculopathy, cervical region: Secondary | ICD-10-CM | POA: Diagnosis not present

## 2022-07-12 DIAGNOSIS — R7303 Prediabetes: Secondary | ICD-10-CM | POA: Diagnosis not present

## 2022-07-12 DIAGNOSIS — E7849 Other hyperlipidemia: Secondary | ICD-10-CM | POA: Diagnosis not present

## 2022-07-12 DIAGNOSIS — D6869 Other thrombophilia: Secondary | ICD-10-CM | POA: Diagnosis not present

## 2022-07-12 DIAGNOSIS — D649 Anemia, unspecified: Secondary | ICD-10-CM | POA: Diagnosis not present

## 2022-07-19 DIAGNOSIS — E785 Hyperlipidemia, unspecified: Secondary | ICD-10-CM | POA: Diagnosis not present

## 2022-07-19 DIAGNOSIS — I10 Essential (primary) hypertension: Secondary | ICD-10-CM | POA: Diagnosis not present

## 2022-07-19 DIAGNOSIS — Z8679 Personal history of other diseases of the circulatory system: Secondary | ICD-10-CM | POA: Diagnosis not present

## 2022-07-19 DIAGNOSIS — R7303 Prediabetes: Secondary | ICD-10-CM | POA: Diagnosis not present

## 2022-07-19 DIAGNOSIS — I739 Peripheral vascular disease, unspecified: Secondary | ICD-10-CM | POA: Diagnosis not present

## 2022-07-19 DIAGNOSIS — D6869 Other thrombophilia: Secondary | ICD-10-CM | POA: Diagnosis not present

## 2022-07-19 DIAGNOSIS — I480 Paroxysmal atrial fibrillation: Secondary | ICD-10-CM | POA: Diagnosis not present

## 2023-01-12 DIAGNOSIS — D649 Anemia, unspecified: Secondary | ICD-10-CM | POA: Diagnosis not present

## 2023-01-12 DIAGNOSIS — E785 Hyperlipidemia, unspecified: Secondary | ICD-10-CM | POA: Diagnosis not present

## 2023-01-12 DIAGNOSIS — R7303 Prediabetes: Secondary | ICD-10-CM | POA: Diagnosis not present

## 2023-01-17 ENCOUNTER — Ambulatory Visit: Payer: Medicare HMO | Admitting: Urology

## 2023-01-18 ENCOUNTER — Encounter: Payer: Self-pay | Admitting: Urology

## 2023-01-18 ENCOUNTER — Ambulatory Visit: Payer: Medicare HMO | Admitting: Urology

## 2023-01-18 VITALS — BP 165/82 | HR 69 | Ht 72.0 in | Wt 186.0 lb

## 2023-01-18 DIAGNOSIS — N400 Enlarged prostate without lower urinary tract symptoms: Secondary | ICD-10-CM | POA: Diagnosis not present

## 2023-01-18 LAB — BLADDER SCAN AMB NON-IMAGING: Scan Result: 67

## 2023-01-18 MED ORDER — FINASTERIDE 5 MG PO TABS: 5.0000 mg | ORAL_TABLET | Freq: Every day | ORAL | 3 refills | Status: DC

## 2023-01-18 NOTE — Progress Notes (Signed)
   01/18/2023 11:49 AM   Kenneth Hawkins 1945/11/14 [id number]  Reason for visit: Follow up BPH and ED   HPI: I saw Kenneth Hawkins back in urology clinic today for follow-up of the above issues.  He is a 77 year old male who has mild urinary symptoms that are well controlled on finasteride, and have been stable over the last few years.  He really denies any significant urinary complaints today, and PVR is normal at 67 mL.  We previously had considered coming off the finasteride with his minimal symptoms, but ultimately he opted to continue that medication.  After discussion again today, he would like to continue the finasteride long-term.  We elected to discontinue PSA screening previously secondary to his age and the AUA guidelines.  Last PSA was very low at 0.5 in October 2021, 1.0 corrected for finasteride.  Recent urinalysis from 01/12/2023 is completely benign.  Previously had Cialis 10 to 20 mg on demand for ED, he and his wife are no longer sexually active, not interested in further treatments for ED.  Finasteride refilled RTC 1 year symptom check and PVR-> if doing well at that time likely can follow-up with PCP for finasteride refills  Sondra Come, MD  [facility] [address], [address] [place], [place] [address] [phone]

## 2023-01-19 DIAGNOSIS — Z1331 Encounter for screening for depression: Secondary | ICD-10-CM | POA: Diagnosis not present

## 2023-01-19 DIAGNOSIS — D6869 Other thrombophilia: Secondary | ICD-10-CM | POA: Diagnosis not present

## 2023-01-19 DIAGNOSIS — E785 Hyperlipidemia, unspecified: Secondary | ICD-10-CM | POA: Diagnosis not present

## 2023-01-19 DIAGNOSIS — I739 Peripheral vascular disease, unspecified: Secondary | ICD-10-CM | POA: Diagnosis not present

## 2023-01-19 DIAGNOSIS — Z8679 Personal history of other diseases of the circulatory system: Secondary | ICD-10-CM | POA: Diagnosis not present

## 2023-01-19 DIAGNOSIS — I6523 Occlusion and stenosis of bilateral carotid arteries: Secondary | ICD-10-CM | POA: Diagnosis not present

## 2023-01-19 DIAGNOSIS — I480 Paroxysmal atrial fibrillation: Secondary | ICD-10-CM | POA: Diagnosis not present

## 2023-01-19 DIAGNOSIS — Z0000 Encounter for general adult medical examination without abnormal findings: Secondary | ICD-10-CM | POA: Diagnosis not present

## 2023-01-19 DIAGNOSIS — I10 Essential (primary) hypertension: Secondary | ICD-10-CM | POA: Diagnosis not present

## 2023-02-23 DIAGNOSIS — R399 Unspecified symptoms and signs involving the genitourinary system: Secondary | ICD-10-CM | POA: Diagnosis not present

## 2023-02-28 ENCOUNTER — Other Ambulatory Visit: Payer: Self-pay | Admitting: Urology

## 2023-02-28 DIAGNOSIS — N400 Enlarged prostate without lower urinary tract symptoms: Secondary | ICD-10-CM

## 2023-03-23 DIAGNOSIS — I6930 Unspecified sequelae of cerebral infarction: Secondary | ICD-10-CM | POA: Diagnosis not present

## 2023-03-23 DIAGNOSIS — H26493 Other secondary cataract, bilateral: Secondary | ICD-10-CM | POA: Diagnosis not present

## 2023-05-14 DIAGNOSIS — R399 Unspecified symptoms and signs involving the genitourinary system: Secondary | ICD-10-CM | POA: Diagnosis not present

## 2023-07-02 DIAGNOSIS — D6869 Other thrombophilia: Secondary | ICD-10-CM | POA: Diagnosis not present

## 2023-07-02 DIAGNOSIS — R7303 Prediabetes: Secondary | ICD-10-CM | POA: Diagnosis not present

## 2023-07-02 DIAGNOSIS — I739 Peripheral vascular disease, unspecified: Secondary | ICD-10-CM | POA: Diagnosis not present

## 2023-07-02 DIAGNOSIS — E785 Hyperlipidemia, unspecified: Secondary | ICD-10-CM | POA: Diagnosis not present

## 2023-07-09 DIAGNOSIS — I739 Peripheral vascular disease, unspecified: Secondary | ICD-10-CM | POA: Diagnosis not present

## 2023-07-09 DIAGNOSIS — N3090 Cystitis, unspecified without hematuria: Secondary | ICD-10-CM | POA: Diagnosis not present

## 2023-07-09 DIAGNOSIS — E785 Hyperlipidemia, unspecified: Secondary | ICD-10-CM | POA: Diagnosis not present

## 2023-07-09 DIAGNOSIS — I10 Essential (primary) hypertension: Secondary | ICD-10-CM | POA: Diagnosis not present

## 2023-07-09 DIAGNOSIS — I480 Paroxysmal atrial fibrillation: Secondary | ICD-10-CM | POA: Diagnosis not present

## 2023-07-09 DIAGNOSIS — D6869 Other thrombophilia: Secondary | ICD-10-CM | POA: Diagnosis not present

## 2023-07-09 DIAGNOSIS — R7303 Prediabetes: Secondary | ICD-10-CM | POA: Diagnosis not present

## 2023-07-09 DIAGNOSIS — D509 Iron deficiency anemia, unspecified: Secondary | ICD-10-CM | POA: Diagnosis not present

## 2023-07-10 DIAGNOSIS — R300 Dysuria: Secondary | ICD-10-CM | POA: Diagnosis not present

## 2023-10-25 ENCOUNTER — Encounter: Payer: Self-pay | Admitting: Urology

## 2024-01-15 DIAGNOSIS — R7303 Prediabetes: Secondary | ICD-10-CM | POA: Diagnosis not present

## 2024-01-15 DIAGNOSIS — E785 Hyperlipidemia, unspecified: Secondary | ICD-10-CM | POA: Diagnosis not present

## 2024-01-15 DIAGNOSIS — I739 Peripheral vascular disease, unspecified: Secondary | ICD-10-CM | POA: Diagnosis not present

## 2024-01-15 DIAGNOSIS — I480 Paroxysmal atrial fibrillation: Secondary | ICD-10-CM | POA: Diagnosis not present

## 2024-01-22 DIAGNOSIS — Z8679 Personal history of other diseases of the circulatory system: Secondary | ICD-10-CM | POA: Diagnosis not present

## 2024-01-22 DIAGNOSIS — Z0000 Encounter for general adult medical examination without abnormal findings: Secondary | ICD-10-CM | POA: Diagnosis not present

## 2024-01-22 DIAGNOSIS — I739 Peripheral vascular disease, unspecified: Secondary | ICD-10-CM | POA: Diagnosis not present

## 2024-01-22 DIAGNOSIS — I480 Paroxysmal atrial fibrillation: Secondary | ICD-10-CM | POA: Diagnosis not present

## 2024-01-22 DIAGNOSIS — E785 Hyperlipidemia, unspecified: Secondary | ICD-10-CM | POA: Diagnosis not present

## 2024-01-22 DIAGNOSIS — R7303 Prediabetes: Secondary | ICD-10-CM | POA: Diagnosis not present

## 2024-01-22 DIAGNOSIS — I10 Essential (primary) hypertension: Secondary | ICD-10-CM | POA: Diagnosis not present

## 2024-01-22 DIAGNOSIS — Z1331 Encounter for screening for depression: Secondary | ICD-10-CM | POA: Diagnosis not present

## 2024-01-22 DIAGNOSIS — D6869 Other thrombophilia: Secondary | ICD-10-CM | POA: Diagnosis not present

## 2024-01-22 DIAGNOSIS — K219 Gastro-esophageal reflux disease without esophagitis: Secondary | ICD-10-CM | POA: Diagnosis not present

## 2024-01-24 ENCOUNTER — Ambulatory Visit: Payer: Medicare HMO | Admitting: Urology

## 2024-01-30 ENCOUNTER — Ambulatory Visit: Admitting: Urology

## 2024-01-30 ENCOUNTER — Encounter: Payer: Self-pay | Admitting: Urology

## 2024-01-30 VITALS — BP 160/77 | HR 82 | Ht 70.0 in | Wt 190.0 lb

## 2024-01-30 DIAGNOSIS — N400 Enlarged prostate without lower urinary tract symptoms: Secondary | ICD-10-CM | POA: Diagnosis not present

## 2024-01-30 MED ORDER — FINASTERIDE 5 MG PO TABS: 5.0000 mg | ORAL_TABLET | Freq: Every day | ORAL | 3 refills | Status: DC

## 2024-01-30 NOTE — Progress Notes (Signed)
   01/30/2024 10:14 AM   Kenneth Hawkins [birth year] [id number]  Reason for visit: Follow up BPH  History: Long history of mild urinary symptoms well-controlled on finasteride , PVRs have always been normal Phased out of PSA screening, last value was 0.23 December 2019, corrected to 1.0 for finasteride  Previously on PDE5 inhibitors for ED, no longer sexually active  Physical Exam: BP (!) 160/77 (BP Location: Left Arm, Patient Position: Sitting, Cuff Size: Normal)   Pulse 82   Ht 5' 10 (1.778 m)   Wt 190 lb (86.2 kg)   SpO2 99%   BMI 27.26 kg/m   Imaging/labs: Urinalysis October 2025 benign Normal renal function, creatinine 1.0, eGFR greater than 60  Today: Overall doing well, no significant urinary complaints today, PVR normal at 8ml He reports a UTI in April 2025, symptoms resolved with antibiotics  Plan:   BPH: He would like to continue on finasteride .  Risks and benefits reviewed.  This can be filled by PCP moving forward.  Reassurance provided regarding normal PVR and recent normal urinalysis UTI: If recurrent infections would consider further evaluation with CT/cystoscopy Follow-up with urology as needed   Kenneth JAYSON Burnet, MD  [facility] [address] [place], [place] [address] [phone]

## 2024-01-31 ENCOUNTER — Ambulatory Visit: Payer: Self-pay | Admitting: Urology

## 2024-03-24 ENCOUNTER — Emergency Department

## 2024-03-24 ENCOUNTER — Other Ambulatory Visit: Payer: Self-pay

## 2024-03-24 ENCOUNTER — Emergency Department
Admission: EM | Admit: 2024-03-24 | Discharge: 2024-03-24 | Disposition: A | Discharge status: Home / Self Care | Attending: Emergency Medicine | Admitting: Emergency Medicine

## 2024-03-24 DIAGNOSIS — Y92002 Bathroom of unspecified non-institutional (private) residence single-family (private) house as the place of occurrence of the external cause: Secondary | ICD-10-CM | POA: Insufficient documentation

## 2024-03-24 DIAGNOSIS — I10 Essential (primary) hypertension: Secondary | ICD-10-CM | POA: Insufficient documentation

## 2024-03-24 DIAGNOSIS — S0990XA Unspecified injury of head, initial encounter: Secondary | ICD-10-CM | POA: Diagnosis not present

## 2024-03-24 DIAGNOSIS — W1830XA Fall on same level, unspecified, initial encounter: Secondary | ICD-10-CM | POA: Diagnosis not present

## 2024-03-24 DIAGNOSIS — W19XXXA Unspecified fall, initial encounter: Secondary | ICD-10-CM

## 2024-03-24 DIAGNOSIS — R42 Dizziness and giddiness: Secondary | ICD-10-CM | POA: Diagnosis present

## 2024-03-24 LAB — BASIC METABOLIC PANEL WITH GFR
Anion gap: 11 (ref 5–15)
BUN: 11 mg/dL (ref 8–23)
CO2: 27 mmol/L (ref 22–32)
Calcium: 9.1 mg/dL (ref 8.9–10.3)
Chloride: 105 mmol/L (ref 98–111)
Creatinine, Ser: 1.04 mg/dL (ref 0.61–1.24)
GFR, Estimated: >60 mL/min
Glucose, Bld: 144 mg/dL — ABNORMAL HIGH (ref 70–99)
Potassium: 3.7 mmol/L (ref 3.5–5.1)
Sodium: 143 mmol/L (ref 135–145)

## 2024-03-24 LAB — URINALYSIS, ROUTINE W REFLEX MICROSCOPIC
Bilirubin Urine: NEGATIVE
Glucose, UA: NEGATIVE mg/dL
Hgb urine dipstick: NEGATIVE
Ketones, ur: NEGATIVE mg/dL
Leukocytes,Ua: NEGATIVE
Nitrite: NEGATIVE
Protein, ur: NEGATIVE mg/dL
Specific Gravity, Urine: 1.004 — ABNORMAL LOW (ref 1.005–1.030)
pH: 7 (ref 5.0–8.0)

## 2024-03-24 LAB — CBC
HCT: 47.1 % (ref 39.0–52.0)
Hemoglobin: 15 g/dL (ref 13.0–17.0)
MCH: 27.4 pg (ref 26.0–34.0)
MCHC: 31.8 g/dL (ref 30.0–36.0)
MCV: 86.1 fL (ref 80.0–100.0)
Platelets: 273 K/uL (ref 150–400)
RBC: 5.47 MIL/uL (ref 4.22–5.81)
RDW: 14.2 % (ref 11.5–15.5)
WBC: 13.4 K/uL — ABNORMAL HIGH (ref 4.0–10.5)
nRBC: 0 % (ref 0.0–0.2)

## 2024-03-24 LAB — TROPONIN T, HIGH SENSITIVITY
Troponin T High Sensitivity: 19 ng/L (ref 0–19)
Troponin T High Sensitivity: 21 ng/L — ABNORMAL HIGH (ref 0–19)

## 2024-03-24 MED ORDER — SODIUM CHLORIDE 0.9 % IV BOLUS
1000.0000 mL | Freq: Once | INTRAVENOUS | Status: AC
  Administered 2024-03-24: 1000 mL via INTRAVENOUS

## 2024-03-24 NOTE — ED Triage Notes (Signed)
 First nurse note: pt to ED [facility] from home for fall in shower, reports dizziness caused fall. Bruising above right eyebrow. Denies LOC. +eliquis.  18g L AC 500NS PTA

## 2024-03-24 NOTE — Discharge Instructions (Addendum)
 Your blood work, EKG, and imaging are at your baseline.  Please follow-up with your cardiologist please return for any new, worsening, or changing symptoms or other concerns.  It was a pleasure caring for you today.

## 2024-03-24 NOTE — ED Notes (Signed)
 Pt in CT at this time and will bring to room when finished

## 2024-03-24 NOTE — ED Provider Notes (Signed)
 "  [facility] Provider Note    Event Date/Time   First MD Initiated Contact with Patient 03/24/24 1003     (approximate)   History   Fall and Dizziness   HPI  Kenneth Hawkins is a 79 y.o. male with a past medical history of hyperlipidemia, hypertension, peripheral vascular disease, anemia who presents today for evaluation after a fall in the bathroom.  Patient reports that he was feeling lightheaded prior to the fall.  No chest pain or shortness of breath.  He hit his head on the tile floor but was able to get himself up before the ambulance came.  He has not attempted to weight-bear since the fall but denies any pain anywhere.  He does not currently feel lightheaded.  Patient Active Problem List   Diagnosis Date Noted   BPH (benign prostatic hyperplasia) 12/06/2017   Anemia 12/02/2015   Erectile dysfunction 12/02/2015   GERD (gastroesophageal reflux disease) 12/02/2015   History of recurrent TIAs 12/02/2015   Hyperglycemia 12/02/2015   Hyperlipidemia 12/02/2015   Hypertension 12/02/2015   PVD (peripheral vascular disease) 12/02/2015          Physical Exam   Triage Vital Signs: ED Triage Vitals  Encounter Vitals Group     BP 03/24/24 0942 133/72     Girls Systolic BP Percentile --      Girls Diastolic BP Percentile --      Boys Systolic BP Percentile --      Boys Diastolic BP Percentile --      Pulse Rate 03/24/24 0944 61     Resp 03/24/24 0942 18     Temp 03/24/24 0942 97.9 F (36.6 C)     Temp src --      SpO2 03/24/24 0944 96 %     Weight 03/24/24 0942 185 lb (83.9 kg)     Height 03/24/24 0942 5' 10 (1.778 m)     Head Circumference --      Peak Flow --      Pain Score 03/24/24 0942 0     Pain Loc --      Pain Education --      Exclude from Growth Chart --     Most recent vital signs: Vitals:   03/24/24 1012 03/24/24 1320  BP:  129/70  Pulse:  65  Resp:  18  Temp:    SpO2: 98% 100%    Physical Exam Vitals and nursing  note reviewed.  Constitutional:      General: Awake and alert. No acute distress.    Appearance: Normal appearance. The patient is normal weight.  HENT:     Head: Normocephalic and atraumatic.     Mouth: Mucous membranes are moist.  Eyes:     General: PERRL. Normal EOMs        Right eye: No discharge.        Left eye: No discharge.     Conjunctiva/sclera: Conjunctivae normal.  Cardiovascular:     Rate and Rhythm: Normal rate and irregular rhythm.     Pulses: Normal pulses.  Pulmonary:     Effort: Pulmonary effort is normal. No respiratory distress.     Breath sounds: Normal breath sounds.  Abdominal:     Abdomen is soft. There is no abdominal tenderness. No rebound or guarding. No distention. Musculoskeletal:        General: No swelling. Normal range of motion.     Cervical back: Normal range of motion and neck  supple.  Skin:    General: Skin is warm and dry.     Capillary Refill: Capillary refill takes less than 2 seconds.     Findings: No rash.  Neurological:     Mental Status: The patient is awake and alert.   Neurological: GCS 15 alert and oriented x3 Normal speech, no expressive or receptive aphasia or dysarthria Cranial nerves II through XII intact Normal visual fields 5 out of 5 strength in all 4 extremities with intact sensation throughout No extremity drift Normal finger-to-nose testing, no limb or truncal ataxia    ED Results / Procedures / Treatments   Labs (all labs ordered are listed, but only abnormal results are displayed) Labs Reviewed  CBC - Abnormal; Notable for the following components:      Result Value   WBC 13.4 (*)    All other components within normal limits  BASIC METABOLIC PANEL WITH GFR - Abnormal; Notable for the following components:   Glucose, Bld 144 (*)    All other components within normal limits  URINALYSIS, ROUTINE W REFLEX MICROSCOPIC - Abnormal; Notable for the following components:   Color, Urine COLORLESS (*)    APPearance  CLEAR (*)    Specific Gravity, Urine 1.004 (*)    All other components within normal limits  TROPONIN T, HIGH SENSITIVITY - Abnormal; Notable for the following components:   Troponin T High Sensitivity 21 (*)    All other components within normal limits  TROPONIN T, HIGH SENSITIVITY     EKG     RADIOLOGY I independently reviewed and interpreted imaging and agree with radiologists findings.     PROCEDURES:  Critical Care performed:   Procedures   MEDICATIONS ORDERED IN ED: Medications  sodium chloride  0.9 % bolus 1,000 mL (0 mLs Intravenous Stopped 03/24/24 1210)     IMPRESSION / MDM / ASSESSMENT AND PLAN / ED COURSE  I reviewed the triage vital signs and the nursing notes.   Differential diagnosis includes, but is not limited to, arrhythmia, viral syndrome, electrolyte disarray, dehydration, anemia.  Patient is awake and alert, hemodynamically stable and afebrile.  He has no focal neurological deficits and has a GCS of 15.  He is nontoxic in appearance.  He was placed on the cardiac monitor.  Further workup is indicated.  EKG obtained at triage reveals a irregularly irregular rhythm, consistent with his known A-fib.  This was also reviewed by Dr. Arlander.  Labs obtained are overall reassuring, troponin x 2 obtained with an elevation from 19-21.  This was discussed with Dr. Arlander who did not feel that repeat was necessary.  CT head and neck ordered from triage were negative for any acute findings.  Patient was reevaluated multiple times throughout his emergency department stay with improvement of his symptoms.  He feels back to his baseline and is able to ambulate unassisted with a steady gait.  I recommend close outpatient follow-up with his cardiologist.  Referral was placed.  Patient understands and agrees with plan.  Discharged in stable condition.  Patient discussed with Dr. Arlander who agrees with assessment and plan.   Patient's presentation is most consistent with  acute presentation with potential threat to life or bodily function.    FINAL CLINICAL IMPRESSION(S) / ED DIAGNOSES   Final diagnoses:  Dizziness  Injury of head, initial encounter  Fall, initial encounter     Rx / DC Orders   ED Discharge Orders          Ordered  Ambulatory referral to Cardiology        03/24/24 1312             Note:  This document was prepared using Dragon voice recognition software and may include unintentional dictation errors.   Leeroy Lovings E, PA-C 03/24/24 1653    Arlander Charleston, MD 03/25/24 [phone]  "

## 2024-03-24 NOTE — ED Notes (Signed)
Pt roomed at this time.

## 2024-03-28 ENCOUNTER — Ambulatory Visit

## 2024-03-28 ENCOUNTER — Ambulatory Visit: Attending: Cardiovascular Disease | Admitting: Cardiovascular Disease

## 2024-03-28 ENCOUNTER — Encounter: Payer: Self-pay | Admitting: Cardiovascular Disease

## 2024-03-28 VITALS — BP 156/77 | HR 71 | Ht 72.0 in | Wt 192.0 lb

## 2024-03-28 DIAGNOSIS — I779 Disorder of arteries and arterioles, unspecified: Secondary | ICD-10-CM | POA: Diagnosis not present

## 2024-03-28 DIAGNOSIS — R55 Syncope and collapse: Secondary | ICD-10-CM

## 2024-03-28 DIAGNOSIS — I480 Paroxysmal atrial fibrillation: Secondary | ICD-10-CM

## 2024-03-28 DIAGNOSIS — I10 Essential (primary) hypertension: Secondary | ICD-10-CM

## 2024-03-28 DIAGNOSIS — I4891 Unspecified atrial fibrillation: Secondary | ICD-10-CM

## 2024-03-28 DIAGNOSIS — E7800 Pure hypercholesterolemia, unspecified: Secondary | ICD-10-CM

## 2024-03-28 NOTE — Progress Notes (Signed)
 "    Cardiology Office Note   Date:  03/28/2024   ID:  Kenneth Hawkins, DOB 1945-04-02, MRN [id number]  PCP:  Kenneth Ophelia JINNY DOUGLAS, MD  Cardiologist:   Kenneth Cage, MD   Chief Complaint  Patient presents with   New Patient (Initial Visit)    Dizziness No complaints today. Meds reviewed verbally with pt.      History of Present Illness: Kenneth Hawkins is a 79 y.o. male who was referred from [facility] ED for evaluation of presyncope. He has known history of paroxysmal atrial fibrillation detected on outpatient monitor on anticoagulation, previous intracranial bleed on warfarin, remote history of abnormal stress test with subsequent cardiac catheterization not showing obstructive disease, recurrent TIAs with evidence of strokes on imaging essential hypertension, hyperlipidemia and carotid disease.  He has history of prior tobacco and alcohol  use but quit in 2005. He had prior cardiac catheterization at [facility] in 2007 that showed insignificant coronary artery disease.  He is noted to have bilateral carotid artery occlusions on previous CT imaging. He was recently taking a shower and felt lightheaded and dizzy.  He fell but fortunately no significant injuries.  He reports no full loss of consciousness.  He denies chest pain or worsening dyspnea.  He was evaluated in the ED and was thought to be mildly volume depleted.  He improved with hydration.  No recurrent episodes since then.   Past Medical History:  Diagnosis Date   Anemia    BPH (benign prostatic hyperplasia)    BPH with obstruction/lower urinary tract symptoms    CVA (cerebral vascular accident) [facility])    ED (erectile dysfunction)    GERD (gastroesophageal reflux disease)    History of TIAs    Hyperglycemia    Hyperlipemia    Hypertension    Leg edema    Peripheral vascular disease    Prostatitis     Past Surgical History:  Procedure Laterality Date   COLONOSCOPY WITH ESOPHAGOGASTRODUODENOSCOPY (EGD)     COLONOSCOPY WITH PROPOFOL   N/A 02/01/2015   Procedure: COLONOSCOPY WITH PROPOFOL ;  Surgeon: Kenneth Hawkins Mariner, MD;  Location: [facility] ENDOSCOPY;  Service: Endoscopy;  Laterality: N/A;   COLONOSCOPY WITH PROPOFOL   04/25/2018   Procedure: COLONOSCOPY WITH PROPOFOL ;  Surgeon: Hawkins Kenneth RAYMOND, MD;  Location: [facility] ENDOSCOPY;  Service: Endoscopy;;   ESOPHAGOGASTRODUODENOSCOPY (EGD) WITH PROPOFOL  N/A 04/25/2018   Procedure: ESOPHAGOGASTRODUODENOSCOPY (EGD) WITH PROPOFOL ;  Surgeon: Hawkins Kenneth RAYMOND, MD;  Location[facility] ENDOSCOPY;  Service: Endoscopy;  Laterality: N/A;     Current Outpatient Medications  Medication Sig Dispense Refill   amLODipine  (NORVASC ) 10 MG tablet Take 10 mg by mouth daily.     apixaban (ELIQUIS) 5 MG TABS tablet Take by mouth. (Patient taking differently: Take 5 mg by mouth 2 (two) times daily.)     atorvastatin  (LIPITOR) 80 MG tablet Take 80 mg by mouth daily.     finasteride  (PROSCAR ) 5 MG tablet Take 1 tablet (5 mg total) by mouth daily. 90 tablet 3   furosemide (LASIX) 40 MG tablet Take 40 mg by mouth daily.     metoprolol  tartrate (LOPRESSOR ) 25 MG tablet Take 25 mg by mouth 2 (two) times daily.     omeprazole (PRILOSEC) 20 MG capsule TAKE 1 CAPSULE EVERY DAY (Patient taking differently: 20 mg daily.)     potassium chloride (KLOR-CON) 10 MEQ tablet  (Patient taking differently: 10 mEq daily.)     tadalafil  (CIALIS ) 20 MG tablet Take 1 tablet (20 mg total) by  mouth daily as needed for erectile dysfunction. 10 tablet 11   aspirin EC 81 MG tablet Take by mouth. (Patient not taking: Reported on 03/28/2024)     ferrous sulfate 325 (65 FE) MG EC tablet Take 325 mg by mouth 3 (three) times daily with meals. (Patient not taking: Reported on 03/28/2024)     No current facility-administered medications for this visit.    Allergies:   Ace inhibitors    Social History:  The patient  reports that he quit smoking about 21 years ago. His smoking use included cigarettes. He has never used smokeless tobacco. He  reports that he does not drink alcohol  and does not use drugs.   Family History:  The patient's family history is not on file.    ROS:  Please see the history of present illness.   Otherwise, review of systems are positive for none.   All other systems are reviewed and negative.    PHYSICAL EXAM: VS:  BP (!) 156/77 (BP Location: Right Arm, Cuff Size: Normal)   Pulse 71   Ht 6' (1.829 m)   Wt 192 lb (87.1 kg)   SpO2 97%   BMI 26.04 kg/m  , BMI Body mass index is 26.04 kg/m. GEN: Well nourished, well developed, in no acute distress  HEENT: normal  Neck: no JVD, carotid bruits, or masses Cardiac: RRR; no murmurs, rubs, or gallops,no edema  Respiratory:  clear to auscultation bilaterally, normal work of breathing GI: soft, nontender, nondistended, + BS MS: no deformity or atrophy  Skin: warm and dry, no rash Neuro:  Strength and sensation are intact Psych: euthymic mood, full affect   EKG:  EKG is ordered today. The ekg ordered today demonstrates : Sinus rhythm with Premature atrial complexes       Recent Labs: 03/24/2024: BUN 11; Creatinine, Ser 1.04; Hemoglobin 15.0; Platelets 273; Potassium 3.7; Sodium 143    Lipid Panel No results found for: CHOL, TRIG, HDL, CHOLHDL, VLDL, LDLCALC, LDLDIRECT    Wt Readings from Last 3 Encounters:  03/28/24 192 lb (87.1 kg)  03/24/24 185 lb (83.9 kg)  01/30/24 190 lb (86.2 kg)         03/28/2024    9:15 AM  PAD Screen  Previous PAD dx? No  Previous surgical procedure? No  Pain with walking? No  Feet/toe relief with dangling? No  Painful, non-healing ulcers? No  Extremities discolored? No      ASSESSMENT AND PLAN:  1.  Recent presyncope: Likely due to orthostatic hypotension.  He was not drinking enough fluids.  He is not orthostatic by exam today.  Given known history of paroxysmal atrial fibrillation, we will have to exclude arrhythmia as a culprit.  I requested a 2-week ZIO monitor.  2.  Paroxysmal  atrial fibrillation: He seems to have frequent PACs.  Atrial fibrillation was detected on previous outpatient monitor.  He has been on anticoagulation given his recurrent CVAs.  Continue Eliquis.  Will obtain an echocardiogram.  3.  History of bilateral carotid artery occlusions: I requested carotid Doppler to evaluate vertebral flow and see if there is any vascular insufficiency contributing to his recent presyncope.  By exam, he has equal pulses in both arms with no evidence of subclavian artery stenosis.  4.  Essential hypertension: Blood pressure is mildly elevated today.  We might need to allow permissive hypertension given bilateral carotid artery occlusion.  5.  Hyperlipidemia: Currently on atorvastatin  80 mg daily.  Most recent lipid profile showed an LDL  of 45.    Disposition:   FU with me in 2 months  Signed,  Kenneth Cage, MD  03/28/2024 10:57 AM    [facility] "

## 2024-03-28 NOTE — Patient Instructions (Addendum)
 Medication Instructions:  Your physician recommends that you continue on your current medications as directed. Please refer to the Current Medication list given to you today.    *If you need a refill on your cardiac medications before your next appointment, please call your pharmacy*  Lab Work: No labs ordered today    Testing/Procedures: Your physician has requested that you have an echocardiogram. Echocardiography is a painless test that uses sound waves to create images of your heart. It provides your doctor with information about the size and shape of your heart and how well your hearts chambers and valves are working.   You may receive an ultrasound enhancing agent through an IV if needed to better visualize your heart during the echo. This procedure takes approximately one hour.  There are no restrictions for this procedure.  This will take place at [address] [facility] [address], [place] [address]  Please note: We ask at that you not bring children with you during ultrasound (echo/ vascular) testing. Due to room size and safety concerns, children are not allowed in the ultrasound rooms during exams. Our front office staff cannot provide observation of children in our lobby area while testing is being conducted. An adult accompanying a patient to their appointment will only be allowed in the ultrasound room at the discretion of the ultrasound technician under special circumstances. We apologize for any inconvenience.   Your physician has requested that you have a carotid duplex. This test is an ultrasound of the carotid arteries in your neck. It looks at blood flow through these arteries that supply the brain with blood.   Allow one hour for this exam.  There are no restrictions or special instructions.  This will take place at [address] [facility] [address], [place] [address]  Please note: We ask at that you not bring children with you during  ultrasound (echo/ vascular) testing. Due to room size and safety concerns, children are not allowed in the ultrasound rooms during exams. Our front office staff cannot provide observation of children in our lobby area while testing is being conducted. An adult accompanying a patient to their appointment will only be allowed in the ultrasound room at the discretion of the ultrasound technician under special circumstances. We apologize for any inconvenience.   ZIO XT- Long Term Monitor Instructions  Your physician has requested you wear a ZIO patch monitor for 14 days.  This is a single patch monitor. [employer] supplies one patch monitor per enrollment. Additional stickers are not available. Please do not apply patch if you will be having a Nuclear Stress Test, Echocardiogram, Cardiac CT, MRI, or Chest Xray during the period you would be wearing the monitor. The patch cannot be worn during these tests. You cannot remove and re-apply the ZIO XT patch monitor.  Your ZIO patch monitor will be mailed 3 day USPS to your address on file. It may take 3-5 days to receive your monitor after you have been enrolled. Once you have received your monitor, please review the enclosed instructions. Your monitor has already been registered assigning a specific monitor serial number to you.  Billing and Patient Assistance Program Information  We have supplied [employer] with any of your insurance information on file for billing purposes.  [employer] offers a sliding scale Patient Assistance Program for patients that do not have insurance, or whose insurance does not completely cover the cost of the ZIO monitor.  You must apply for the Patient Assistance  Program to qualify for this discounted rate.  To apply, please call [employer] at [phone], select option 4, select option 2, ask to apply for Patient Assistance Program. Meredeth will ask your household income, and how many people are in your household. They will quote your  out-of-pocket cost based on that information. [employer] will also be able to set up a 33-month, interest-free payment plan if needed.  Applying the monitor   Shave hair from upper left chest.  Hold abrader disc by orange tab. Rub abrader in 40 strokes over the upper left chest as indicated in your monitor instructions.  Clean area with 4 enclosed alcohol  pads. Let dry.  Apply patch as indicated in monitor instructions. Patch will be placed under collarbone on left side of chest with arrow pointing upward.  Rub patch adhesive wings for 2 minutes. Remove white label marked 1. Remove the white label marked 2. Rub patch adhesive wings for 2 additional minutes.  While looking in a mirror, press and release button in center of patch. A small green light will flash 3-4 times. This will be your only indicator that the monitor has been turned on.  Do not shower for the first 24 hours. You may shower after the first 24 hours.  Press the button if you feel a symptom. You will hear a small click. Record Date, Time and Symptom in the Patient Logbook.  When you are ready to remove the patch, follow instructions on the last 2 pages of Patient Logbook.  Stick patch monitor into the tabs at the bottom of the return box.  Place Patient Logbook in the blue and white box. Use locking tab on box and tape box closed securely. The blue and white box has prepaid postage on it. Please place it in the mailbox as soon as possible. Your physician should have your test results approximately 7-14 days after the monitor has been mailed back to [facility].  Call [facility] Customer Care at [phone] if you have questions regarding your ZIO XT patch monitor.  Call them immediately if you see an orange light blinking on your monitor.  If your monitor falls off in less than 4 days, contact our Monitor department at [phone].  If your monitor becomes loose or falls off after 4 days call [employer] at [phone] for  suggestions on securing your monitor.   Follow-Up: At [facility], you and your health needs are our priority.  As part of our continuing mission to provide you with exceptional heart care, our providers are all part of one team.  This team includes your primary Cardiologist (physician) and Advanced Practice Providers or APPs (Physician Assistants and Nurse Practitioners) who all work together to provide you with the care you need, when you need it.  Your next appointment:   2 month(s)  Provider:   You may see Deatrice Cage, MD or one of the following Advanced Practice Providers on your designated Care Team:   Lonni Meager, NP Lesley Maffucci, PA-C Bernardino Bring, PA-C Cadence Greenleaf, PA-C Tylene Lunch, NP Barnie Hila, NP

## 2024-04-01 ENCOUNTER — Ambulatory Visit: Admitting: Cardiovascular Disease

## 2024-04-10 ENCOUNTER — Telehealth: Payer: Self-pay | Admitting: Cardiovascular Disease

## 2024-04-10 NOTE — Telephone Encounter (Signed)
 Wife stating she was returning call about a sooner appt for today. Please advise

## 2024-04-14 ENCOUNTER — Ambulatory Visit

## 2024-04-16 ENCOUNTER — Other Ambulatory Visit: Payer: Self-pay | Admitting: Urology

## 2024-04-16 DIAGNOSIS — N400 Enlarged prostate without lower urinary tract symptoms: Secondary | ICD-10-CM

## 2024-04-23 ENCOUNTER — Ambulatory Visit

## 2024-05-06 ENCOUNTER — Ambulatory Visit

## 2024-05-27 ENCOUNTER — Ambulatory Visit: Admitting: Cardiovascular Disease
